# Patient Record
Sex: Female | Born: 1950
Health system: Southern US, Community
[De-identification: ages and names within clinical notes are randomized; demographics above are authoritative.]

## PROBLEM LIST (undated history)

## (undated) DIAGNOSIS — E079 Disorder of thyroid, unspecified: Secondary | ICD-10-CM

## (undated) DIAGNOSIS — I10 Essential (primary) hypertension: Secondary | ICD-10-CM

## (undated) DIAGNOSIS — D3A019 Benign carcinoid tumor of the small intestine, unspecified portion: Secondary | ICD-10-CM

## (undated) DIAGNOSIS — E039 Hypothyroidism, unspecified: Secondary | ICD-10-CM

## (undated) HISTORY — PX: HEMORRHOID SURGERY: SHX153

---

## 1998-06-30 ENCOUNTER — Ambulatory Visit (HOSPITAL_COMMUNITY): Admission: RE | Admit: 1998-06-30 | Discharge: 1998-06-30 | Payer: Self-pay | Admitting: Surgery

## 1999-07-22 ENCOUNTER — Other Ambulatory Visit: Admission: RE | Admit: 1999-07-22 | Discharge: 1999-07-22 | Payer: Self-pay | Admitting: Family Medicine

## 1999-10-21 ENCOUNTER — Ambulatory Visit (HOSPITAL_COMMUNITY): Admission: RE | Admit: 1999-10-21 | Discharge: 1999-10-21 | Payer: Self-pay | Admitting: Surgery

## 1999-10-21 ENCOUNTER — Encounter: Payer: Self-pay | Admitting: Surgery

## 1999-12-11 ENCOUNTER — Encounter (HOSPITAL_COMMUNITY): Admission: RE | Admit: 1999-12-11 | Discharge: 2000-03-10 | Payer: Self-pay | Admitting: Surgery

## 2000-07-27 ENCOUNTER — Other Ambulatory Visit: Admission: RE | Admit: 2000-07-27 | Discharge: 2000-07-27 | Payer: Self-pay | Admitting: Family Medicine

## 2004-04-22 ENCOUNTER — Emergency Department (HOSPITAL_COMMUNITY): Admission: EM | Admit: 2004-04-22 | Discharge: 2004-04-23 | Payer: Self-pay | Admitting: Emergency Medicine

## 2004-05-25 ENCOUNTER — Other Ambulatory Visit: Admission: RE | Admit: 2004-05-25 | Discharge: 2004-05-25 | Payer: Self-pay | Admitting: Family Medicine

## 2004-11-17 ENCOUNTER — Other Ambulatory Visit: Admission: RE | Admit: 2004-11-17 | Discharge: 2004-11-17 | Payer: Self-pay | Admitting: Obstetrics and Gynecology

## 2005-01-15 ENCOUNTER — Encounter (INDEPENDENT_AMBULATORY_CARE_PROVIDER_SITE_OTHER): Payer: Self-pay | Admitting: *Deleted

## 2005-01-15 ENCOUNTER — Ambulatory Visit (HOSPITAL_COMMUNITY): Admission: RE | Admit: 2005-01-15 | Discharge: 2005-01-15 | Payer: Self-pay | Admitting: Obstetrics and Gynecology

## 2006-07-21 ENCOUNTER — Other Ambulatory Visit: Admission: RE | Admit: 2006-07-21 | Discharge: 2006-07-21 | Payer: Self-pay | Admitting: Obstetrics and Gynecology

## 2007-08-25 ENCOUNTER — Other Ambulatory Visit: Admission: RE | Admit: 2007-08-25 | Discharge: 2007-08-25 | Payer: Self-pay | Admitting: Obstetrics and Gynecology

## 2009-01-31 ENCOUNTER — Encounter: Admission: RE | Admit: 2009-01-31 | Discharge: 2009-01-31 | Payer: Self-pay | Admitting: Internal Medicine

## 2009-10-28 ENCOUNTER — Encounter: Admission: RE | Admit: 2009-10-28 | Discharge: 2009-10-28 | Payer: Self-pay | Admitting: Gastroenterology

## 2011-04-16 NOTE — Op Note (Signed)
NAMEFINLEIGH, CHEONG               ACCOUNT NO.:  [id number]   MEDICAL RECORD NO.:  [id number]          PATIENT TYPE:  AMB   LOCATION:  SDC                           FACILITY:  [facility]   PHYSICIAN:  Charles A. Delcambre, MDDATE OF BIRTH:  [birth year]   DATE OF PROCEDURE:  01/15/2005  DATE OF DISCHARGE:                                 OPERATIVE REPORT   PREOPERATIVE DIAGNOSIS:  Atypical glandular cells in Papanicolaou smear with  glandular involvement of dysplasia, possible dysplasia.   POSTOPERATIVE DIAGNOSIS:  Atypical glandular cells in Papanicolaou smear  with glandular involvement of dysplasia, possible dysplasia.   PROCEDURES:  1.  Paracervical block.  2.  Cold knife conization.  3.  Endocervical curettage.  4.  Dilation and curettage.   SURGEON:  Charles A. Delcambre, MD   ASSISTANT:  None.   COMPLICATIONS:  None.   ESTIMATED BLOOD LOSS:  Less than 25 mL.   SPECIMENS:  1.  Cold knife conization specimen at 12 o'clock.  2.  Endocervical curettage.  3.  Dilation and curettage.   OPERATIVE FINDINGS:  Sounded to 10 cm.   DESCRIPTION OF PROCEDURE:  The patient was taken to the operating room and  placed in the supine position.  A general anesthetic was induced without  difficulty.  She was then placed in the dorsal lithotomy position and  sterilely prepped and draped.  A single-tooth tenaculum was placed on the  anterior lip of the cervix and a paracervical block was then placed with  0.25% Marcaine with epinephrine.  This was then placed circumferentially  around to assist with hemostasis.  A total of approximately 8 mL, see OR  record for exact amount, was injected in divided.  There was no evidence of  intravascular injection.  A knife was then used to cut the cone specimen  away.  After endocervical curetting was obtained, a D&C was then obtained,  specimen was obtained with some minor dilation with the Hegar dilator.  There was no evidence of perforation.  A small  amount of tissue was  obtained.  Ball cautery was then used at 50 watts to cauterize the cone bed.  The cone was made deeply at the endocervical canal to obtain an adequate  specimen up the canal.  Hemostasis was adequate.  Vicryl 0 was then used to  run the cervical  mucosal edge of the cone, running locking.  Hemostasis was excellent.  Monsel's solution was placed in the cone bed prophylactically.  Hemostasis  was verified to be excellent.  The patient was awakened and taken to  recovery with physician in attendance, having tolerated the procedure well.      CAD/MEDQ  D:  01/15/2005  T:  01/15/2005  Job:  [id number]

## 2011-04-16 NOTE — H&P (Signed)
Anne Stark, Anne Stark               ACCOUNT NO.:  [id number]   MEDICAL RECORD NO.:  [id number]          PATIENT TYPE:  AMB   LOCATION:  SDC                           FACILITY:  [facility]   PHYSICIAN:  Charles A. Delcambre, MDDATE OF BIRTH:  05/01/1951   DATE OF ADMISSION:  01/15/2005  DATE OF DISCHARGE:                                HISTORY & PHYSICAL   This patient to be admitted January 15, 2005, to undergo cold-knife  conization and D&C secondary to adenocarcinoma in situ possibility with  atypical glandular cells on a Pap smear.  There was possible glandular  involvement of dysplasia and AIS must be ruled out, as discussed with the  patient.  She gives informed consent, accepts risks of infection, bleeding,  bowel, rectal, and bladder damage, hepatitis and HIV exposure with blood  products.  All questions are answered.  She will remain NPO past midnight.  EV prior to surgery, take hydrochlorothiazide and use Maxair the morning of  surgery, Maxair the evening prior to surgery.  All questions are answered.  She gives informed consent.   PAST MEDICAL HISTORY:  1.  Hypertension.  2.  Hypothyroidism.  3.  Osteoporosis.  4.  Seasonal allergies.   SURGICAL HISTORY:  1.  Tonsillectomy.  2.  Hemorrhoidectomy.  3.  D&C in 1999.   MEDICATIONS:  1.  HCTZ 25 mg daily.  2.  Maxair aerosol daily.  3.  Zyrtec 10 mg daily.  4.  Synthroid 50 mcg daily.  5.  Baby aspirin 81 mg daily, discontinued December 30, 2004.  6.  Calcium 600 mg daily.  7.  Fosamax 70 mg weekly.   ALLERGIES:  No known drug allergies.   SOCIAL HISTORY:  No tobacco, ethanol, drug use, or STD exposure in the past.  She is married and has a monogamous relationship with her husband.   FAMILY HISTORY:  Father deceased, 84, emphysema.  Mother deceased, 49,  arthritis.  Brother 48 with arthritis.  Sister, 59, bipolar.  Otherwise,  negative family history.   REVIEW OF SYSTEMS:  No fever, chills, rashes, lesions, headaches,  dizziness,  chest pain, shortness of breath, wheezing, diarrhea, constipation, bleeding,  melena, hematochezia, urgency, frequency, dysuria, incontinence, hematuria,  __________, or emotional changes.   PHYSICAL EXAMINATION:  GENERAL:  Alert and oriented x 3, no distress.  VITAL SIGNS:  Blood pressure 120/70, heart rate 70, weight 131 pounds.  NECK:  Supple without thyromegaly or adenopathy.  LUNGS:  Clear bilaterally.  HEART:  Regular rate and rhythm without murmur, rub, or gallop.  BREASTS:  Symmetrical.  The remainder of the examination deferred.  ABDOMEN:  Soft, flat, nontender.  PELVIC:  Normal external female genitalia, Bartholin, urethral, Skene's  within normal limits.  Vault without discharge or lesions.  Multiparous  cervix.  No cervical motion tenderness is present.  Negative colposcopy was  noted.  She was noted to be HPV negative.  Endocervix was benign  endocervical glands on colposcopy.  Low-grade lesion was noted at 9 o'clock.  Endometrial biopsy was benign with superficial endometrial glands.  Uterus  was not enlarged.  Adnexa  nontender.  Ovaries not palpable bilaterally.   ASSESSMENT:  Atypical glandular cells on Pap smear.  Glandular involvement  with possible dysplasia.   PLAN:  Cold-knife conization with D&C __________ will follow with surgery  this Friday.      CAD/MEDQ  D:  01/11/2005  T:  01/11/2005  Job:  [id number]

## 2012-05-11 ENCOUNTER — Other Ambulatory Visit (HOSPITAL_COMMUNITY)
Admission: RE | Admit: 2012-05-11 | Discharge: 2012-05-11 | Disposition: A | Discharge status: Home / Self Care | Payer: BC Managed Care – PPO | Source: Ambulatory Visit | Attending: Family Medicine | Admitting: Family Medicine

## 2012-05-11 ENCOUNTER — Other Ambulatory Visit: Payer: Self-pay | Admitting: Family Medicine

## 2012-05-11 DIAGNOSIS — Z0000 Encounter for general adult medical examination without abnormal findings: Secondary | ICD-10-CM | POA: Insufficient documentation

## 2012-06-09 ENCOUNTER — Encounter (HOSPITAL_COMMUNITY): Payer: Self-pay | Admitting: *Deleted

## 2012-06-09 ENCOUNTER — Emergency Department (HOSPITAL_COMMUNITY)
Admission: EM | Admit: 2012-06-09 | Discharge: 2012-06-09 | Disposition: A | Discharge status: Home / Self Care | Payer: BC Managed Care – PPO | Attending: Emergency Medicine | Admitting: Emergency Medicine

## 2012-06-09 DIAGNOSIS — R109 Unspecified abdominal pain: Secondary | ICD-10-CM

## 2012-06-09 DIAGNOSIS — R1084 Generalized abdominal pain: Secondary | ICD-10-CM | POA: Insufficient documentation

## 2012-06-09 DIAGNOSIS — R1110 Vomiting, unspecified: Secondary | ICD-10-CM | POA: Insufficient documentation

## 2012-06-09 DIAGNOSIS — R197 Diarrhea, unspecified: Secondary | ICD-10-CM | POA: Insufficient documentation

## 2012-06-09 LAB — COMPREHENSIVE METABOLIC PANEL
Albumin: 4.2 g/dL (ref 3.5–5.2)
BUN: 20 mg/dL (ref 6–23)
Creatinine, Ser: 0.83 mg/dL (ref 0.50–1.10)
Total Protein: 7.6 g/dL (ref 6.0–8.3)

## 2012-06-09 LAB — CBC WITH DIFFERENTIAL/PLATELET
Basophils Absolute: 0 10*3/uL (ref 0.0–0.1)
Basophils Relative: 0 % (ref 0–1)
Eosinophils Absolute: 0 10*3/uL (ref 0.0–0.7)
HCT: 44.5 % (ref 36.0–46.0)
Hemoglobin: 15.3 g/dL — ABNORMAL HIGH (ref 12.0–15.0)
MCH: 30.5 pg (ref 26.0–34.0)
MCHC: 34.4 g/dL (ref 30.0–36.0)
Monocytes Absolute: 0.6 10*3/uL (ref 0.1–1.0)
Monocytes Relative: 5 % (ref 3–12)
Neutrophils Relative %: 83 % — ABNORMAL HIGH (ref 43–77)
RDW: 12.5 % (ref 11.5–15.5)

## 2012-06-09 LAB — LIPASE, BLOOD: Lipase: 43 U/L (ref 11–59)

## 2012-06-09 MED ORDER — SODIUM CHLORIDE 0.9 % IV BOLUS (SEPSIS)
1000.0000 mL | Freq: Once | INTRAVENOUS | Status: AC
  Administered 2012-06-09: 1000 mL via INTRAVENOUS

## 2012-06-09 MED ORDER — ONDANSETRON HCL 4 MG/2ML IJ SOLN
4.0000 mg | Freq: Once | INTRAMUSCULAR | Status: AC
  Administered 2012-06-09: 4 mg via INTRAVENOUS
  Filled 2012-06-09: qty 2

## 2012-06-09 MED ORDER — MORPHINE SULFATE 4 MG/ML IJ SOLN
4.0000 mg | Freq: Once | INTRAMUSCULAR | Status: AC
  Administered 2012-06-09: 4 mg via INTRAVENOUS
  Filled 2012-06-09: qty 1

## 2012-06-09 MED ORDER — ONDANSETRON HCL 4 MG PO TABS: 4.0000 mg | ORAL_TABLET | Freq: Four times a day (QID) | ORAL | Status: AC

## 2012-06-09 NOTE — ED Provider Notes (Addendum)
History     CSN: [id number]  Arrival date & time 06/09/12  0405   First MD Initiated Contact with Patient 06/09/12 0411      Chief Complaint  Patient presents with  . Abdominal Pain    (Consider location/radiation/quality/duration/timing/severity/associated sxs/prior treatment) Patient is a 61 y.o. female presenting with abdominal pain. The history is provided by the patient.  Abdominal Pain The primary symptoms of the illness include abdominal pain, nausea, vomiting and diarrhea. The current episode started 6 to 12 hours ago. The onset of the illness was sudden. The problem has not changed since onset. The abdominal pain began 6 to 12 hours ago. The pain came on suddenly. The abdominal pain is generalized (cramping pain that comes in waves). The abdominal pain does not radiate. The severity of the abdominal pain is 8/10. Relieved by: gets better for a little while after vomiting and diarrhea.  Vomiting occurs 2 to 5 times per day. The emesis contains stomach contents.  The diarrhea began today. The diarrhea is watery. The diarrhea occurs 2 to 4 times per day.  Additional symptoms associated with the illness include anorexia and back pain. Symptoms associated with the illness do not include chills, constipation, urgency or frequency. Associated medical issues comments: states this has happened several times.  the last time was 2 years ago.Marland Kitchen    History reviewed. No pertinent past medical history.  History reviewed. No pertinent past surgical history.  History reviewed. No pertinent family history.  History  Substance Use Topics  . Smoking status: Never Smoker   . Smokeless tobacco: Not on file  . Alcohol Use: No    OB History    Grav Para Term Preterm Abortions TAB SAB Ect Mult Living                  Review of Systems  Constitutional: Negative for chills.  Gastrointestinal: Positive for nausea, vomiting, abdominal pain, diarrhea and anorexia. Negative for constipation.    Genitourinary: Negative for urgency and frequency.  Musculoskeletal: Positive for back pain.  All other systems reviewed and are negative.    Allergies  Review of patient's allergies indicates no known allergies.  Home Medications   Current Outpatient Rx  Name Route Sig Dispense Refill  . ALBUTEROL SULFATE HFA 108 (90 BASE) MCG/ACT IN AERS Inhalation Inhale 2 puffs into the lungs every 6 (six) hours as needed. For shortness of breath or wheeze    . ALENDRONATE SODIUM 70 MG PO TABS Oral Take 70 mg by mouth every 7 (seven) days. Take with a full glass of water on an empty stomach.    . ASPIRIN EC 81 MG PO TBEC Oral Take 81 mg by mouth daily.    . ATORVASTATIN CALCIUM 10 MG PO TABS Oral Take 10 mg by mouth daily.    . AZELASTINE HCL 137 MCG/SPRAY NA SOLN Nasal Place 1 spray into the nose 2 (two) times daily as needed. Use in each nostril as directed    . HYDROCHLOROTHIAZIDE 25 MG PO TABS Oral Take 25 mg by mouth daily.    Marland Kitchen LORATADINE 10 MG PO TABS Oral Take 10 mg by mouth daily.    . ADULT MULTIVITAMIN W/MINERALS CH Oral Take 1 tablet by mouth daily.      BP 149/65  Pulse 70  Temp 97.5 F (36.4 C) (Oral)  Resp 18  SpO2 100%  Physical Exam  Nursing note and vitals reviewed. Constitutional: She is oriented to person, place, and time. She appears  well-developed and well-nourished. No distress.  HENT:  Head: Normocephalic and atraumatic.  Mouth/Throat: Oropharynx is clear and moist.  Eyes: Conjunctivae and EOM are normal. Pupils are equal, round, and reactive to light.  Neck: Normal range of motion. Neck supple.  Cardiovascular: Normal rate, regular rhythm and intact distal pulses.   No murmur heard. Pulmonary/Chest: Effort normal and breath sounds normal. No respiratory distress. She has no wheezes. She has no rales.  Abdominal: Soft. Normal appearance. She exhibits no distension. Bowel sounds are decreased. There is generalized tenderness. There is no rebound, no guarding and  no CVA tenderness.  Musculoskeletal: Normal range of motion. She exhibits no edema and no tenderness.  Neurological: She is alert and oriented to person, place, and time.  Skin: Skin is warm and dry. No rash noted. No erythema.  Psychiatric: She has a normal mood and affect. Her behavior is normal.    ED Course  Procedures (including critical care time)  Labs Reviewed  CBC WITH DIFFERENTIAL - Abnormal; Notable for the following:    WBC 11.9 (*)     Hemoglobin 15.3 (*)     Neutrophils Relative 83 (*)     Neutro Abs 9.9 (*)     All other components within normal limits  COMPREHENSIVE METABOLIC PANEL - Abnormal; Notable for the following:    Potassium 3.2 (*)     Glucose, Bld 115 (*)     GFR calc non Af Amer 74 (*)     GFR calc Af Amer 86 (*)     All other components within normal limits  LIPASE, BLOOD   No results found.   1. Vomiting and diarrhea   2. Abdominal  pain, other specified site       MDM   Patient with abdominal cramping, vomiting and diarrhea that started about 6 hours ago. She states it comes in waves and the pain becomes so intense that she can hardly bear it.  She states this has happened multiple times in the past the last episode was 2 years ago. She's been seen by Dr.Buccini and had an endoscopy and colonoscopy without explanation of why this occurs. She denies any bad food exposure or recent sick contacts. No new medication changes. No urinary or pulmonary symptoms.  Her abdomen is mildly diffusely tender without peritoneal signs. CBC with only slight leukocytosis of 11.9 and mild hypokalemia of 3.2 but otherwise within normal limits. Patient given IV fluids, Zofran and morphine for symptomatic treatment.  5:38 AM ON re-eval pt is feeling better and no significant abd pain at this point.  Will po challenge.   6:42 AM Tolerating po's and wants to go home.  Will f/u with GI.     Gwyneth Sprout, MD 06/09/12 [id number]  Gwyneth Sprout, MD 06/09/12  1191  Gwyneth Sprout, MD 06/09/12 [phone]

## 2012-06-09 NOTE — ED Notes (Signed)
Patient states she felt fine yest last pm felt like her left side was swollen she had one loose stool then several episodes of diarrhea vomited x1. States abd. Pain is generalized. States she has experienced same before and was seen by  Her Gi doctor had upper and lower gi studies with no dx.

## 2012-06-09 NOTE — ED Notes (Signed)
Patient resting on stretcher  States she feels better. Husband at bedside.

## 2015-06-18 DIAGNOSIS — E039 Hypothyroidism, unspecified: Secondary | ICD-10-CM | POA: Diagnosis not present

## 2015-06-18 DIAGNOSIS — I10 Essential (primary) hypertension: Secondary | ICD-10-CM | POA: Diagnosis not present

## 2015-06-18 DIAGNOSIS — R400 Somnolence: Secondary | ICD-10-CM | POA: Diagnosis not present

## 2015-06-18 DIAGNOSIS — E780 Pure hypercholesterolemia: Secondary | ICD-10-CM | POA: Diagnosis not present

## 2015-11-23 ENCOUNTER — Emergency Department (HOSPITAL_COMMUNITY)
Admission: EM | Admit: 2015-11-23 | Discharge: 2015-11-23 | Disposition: A | Discharge status: Home / Self Care | Payer: Medicare Other | Attending: Emergency Medicine | Admitting: Emergency Medicine

## 2015-11-23 ENCOUNTER — Encounter (HOSPITAL_COMMUNITY): Payer: Self-pay | Admitting: Emergency Medicine

## 2015-11-23 DIAGNOSIS — R109 Unspecified abdominal pain: Secondary | ICD-10-CM | POA: Diagnosis present

## 2015-11-23 DIAGNOSIS — Z7982 Long term (current) use of aspirin: Secondary | ICD-10-CM | POA: Insufficient documentation

## 2015-11-23 DIAGNOSIS — Z79899 Other long term (current) drug therapy: Secondary | ICD-10-CM | POA: Insufficient documentation

## 2015-11-23 DIAGNOSIS — I10 Essential (primary) hypertension: Secondary | ICD-10-CM | POA: Insufficient documentation

## 2015-11-23 DIAGNOSIS — R112 Nausea with vomiting, unspecified: Secondary | ICD-10-CM | POA: Diagnosis not present

## 2015-11-23 DIAGNOSIS — E079 Disorder of thyroid, unspecified: Secondary | ICD-10-CM | POA: Diagnosis not present

## 2015-11-23 DIAGNOSIS — R197 Diarrhea, unspecified: Secondary | ICD-10-CM | POA: Insufficient documentation

## 2015-11-23 HISTORY — DX: Essential (primary) hypertension: I10

## 2015-11-23 HISTORY — DX: Disorder of thyroid, unspecified: E07.9

## 2015-11-23 LAB — COMPREHENSIVE METABOLIC PANEL
ALT: 22 U/L (ref 14–54)
AST: 30 U/L (ref 15–41)
Albumin: 3.6 g/dL (ref 3.5–5.0)
Alkaline Phosphatase: 53 U/L (ref 38–126)
Anion gap: 8 (ref 5–15)
BILIRUBIN TOTAL: 0.8 mg/dL (ref 0.3–1.2)
BUN: 11 mg/dL (ref 6–20)
CO2: 26 mmol/L (ref 22–32)
CREATININE: 0.85 mg/dL (ref 0.44–1.00)
Calcium: 9.2 mg/dL (ref 8.9–10.3)
Chloride: 108 mmol/L (ref 101–111)
Glucose, Bld: 147 mg/dL — ABNORMAL HIGH (ref 65–99)
POTASSIUM: 3.9 mmol/L (ref 3.5–5.1)
Sodium: 142 mmol/L (ref 135–145)
TOTAL PROTEIN: 6.7 g/dL (ref 6.5–8.1)

## 2015-11-23 LAB — URINALYSIS, ROUTINE W REFLEX MICROSCOPIC
Bilirubin Urine: NEGATIVE
Glucose, UA: NEGATIVE mg/dL
Hgb urine dipstick: NEGATIVE
KETONES UR: 15 mg/dL — AB
NITRITE: NEGATIVE
PROTEIN: NEGATIVE mg/dL
Specific Gravity, Urine: 1.021 (ref 1.005–1.030)
pH: 8.5 — ABNORMAL HIGH (ref 5.0–8.0)

## 2015-11-23 LAB — URINE MICROSCOPIC-ADD ON

## 2015-11-23 LAB — DIFFERENTIAL
BASOS ABS: 0 10*3/uL (ref 0.0–0.1)
BASOS PCT: 0 %
Eosinophils Absolute: 0 10*3/uL (ref 0.0–0.7)
Eosinophils Relative: 0 %
Lymphocytes Relative: 10 %
Lymphs Abs: 1 10*3/uL (ref 0.7–4.0)
Monocytes Absolute: 0.2 10*3/uL (ref 0.1–1.0)
Monocytes Relative: 2 %
NEUTROS ABS: 9.2 10*3/uL — AB (ref 1.7–7.7)
Neutrophils Relative %: 88 %

## 2015-11-23 LAB — CBC
HEMATOCRIT: 44.9 % (ref 36.0–46.0)
Hemoglobin: 14.8 g/dL (ref 12.0–15.0)
MCH: 30.3 pg (ref 26.0–34.0)
MCHC: 33 g/dL (ref 30.0–36.0)
MCV: 92 fL (ref 78.0–100.0)
PLATELETS: 354 10*3/uL (ref 150–400)
RBC: 4.88 MIL/uL (ref 3.87–5.11)
RDW: 12.2 % (ref 11.5–15.5)
WBC: 10.4 10*3/uL (ref 4.0–10.5)

## 2015-11-23 LAB — LIPASE, BLOOD: Lipase: 28 U/L (ref 11–51)

## 2015-11-23 MED ORDER — ONDANSETRON HCL 4 MG/2ML IJ SOLN
4.0000 mg | Freq: Once | INTRAMUSCULAR | Status: AC
  Administered 2015-11-23: 4 mg via INTRAVENOUS
  Filled 2015-11-23: qty 2

## 2015-11-23 MED ORDER — MORPHINE SULFATE (PF) 4 MG/ML IV SOLN
4.0000 mg | Freq: Once | INTRAVENOUS | Status: AC
  Administered 2015-11-23: 4 mg via INTRAVENOUS
  Filled 2015-11-23: qty 1

## 2015-11-23 MED ORDER — SODIUM CHLORIDE 0.9 % IV SOLN
1000.0000 mL | Freq: Once | INTRAVENOUS | Status: AC
  Administered 2015-11-23: 1000 mL via INTRAVENOUS

## 2015-11-23 MED ORDER — DICYCLOMINE HCL 10 MG PO CAPS
20.0000 mg | ORAL_CAPSULE | Freq: Once | ORAL | Status: AC
  Administered 2015-11-23: 20 mg via ORAL
  Filled 2015-11-23: qty 2

## 2015-11-23 MED ORDER — SODIUM CHLORIDE 0.9 % IV SOLN
1000.0000 mL | INTRAVENOUS | Status: DC
  Administered 2015-11-23: 1000 mL via INTRAVENOUS

## 2015-11-23 NOTE — ED Provider Notes (Signed)
CSN: [id number]     Arrival date & time 11/23/15  0009 History  By signing my name below, I, Randa Evens, attest that this documentation has been prepared under the direction and in the presence of Delora Fuel, MD. Electronically Signed: Randa Evens, ED Scribe. 11/23/2015. 3:55 AM.      Chief Complaint  Patient presents with  . Abdominal Pain  . Nausea   The history is provided by the patient.   HPI Comments: Anne Stark is a 64 y.o. female brought in by ambulance, who presents to the Emergency Department complaining of cramping abdominal pain onset 6 hours PTA. Rates the severity of her pain 9/10. Pt states she has associated chills, nausea, diarrhea. She report some relief of the pain while having a BM. Denies any medications PTA. denies recent sick contacts. Denies suspect food intact.    Past Medical History  Diagnosis Date  . Hypertension   . Thyroid disease    History reviewed. No pertinent past surgical history. History reviewed. No pertinent family history. Social History  Substance Use Topics  . Smoking status: Never Smoker   . Smokeless tobacco: None  . Alcohol Use: No   OB History    No data available      Review of Systems  Constitutional: Positive for chills. Negative for fever.  Gastrointestinal: Positive for nausea, abdominal pain and diarrhea. Negative for vomiting.  All other systems reviewed and are negative.    Allergies  Review of patient's allergies indicates no known allergies.  Home Medications   Prior to Admission medications   Medication Sig Start Date End Date Taking? Authorizing Provider  albuterol (PROVENTIL HFA;VENTOLIN HFA) 108 (90 BASE) MCG/ACT inhaler Inhale 2 puffs into the lungs every 6 (six) hours as needed. For shortness of breath or wheeze   Yes Historical Provider, MD  alendronate (FOSAMAX) 70 MG tablet Take 70 mg by mouth every 7 (seven) days. Take with a full glass of water on an empty stomach.   Yes Historical  Provider, MD  aspirin EC 81 MG tablet Take 81 mg by mouth daily.   Yes Historical Provider, MD  atorvastatin (LIPITOR) 10 MG tablet Take 10 mg by mouth daily.   Yes Historical Provider, MD  azelastine (ASTELIN) 137 MCG/SPRAY nasal spray Place 1 spray into the nose 2 (two) times daily as needed for rhinitis or allergies. Use in each nostril as directed   Yes Historical Provider, MD  celecoxib (CELEBREX) 200 MG capsule Take 200 mg by mouth daily as needed for mild pain.   Yes Historical Provider, MD  furosemide (LASIX) 20 MG tablet Take 20 mg by mouth daily.   Yes Historical Provider, MD  Glucosamine Sulfate 1000 MG CAPS Take 1,000 mg by mouth daily.   Yes Historical Provider, MD  hyoscyamine (LEVSIN SL) 0.125 MG SL tablet Place 0.125 mg under the tongue every 4 (four) hours as needed for cramping.   Yes Historical Provider, MD  levothyroxine (SYNTHROID, LEVOTHROID) 50 MCG tablet Take 50 mcg by mouth daily before breakfast.   Yes Historical Provider, MD  loratadine (CLARITIN) 10 MG tablet Take 10 mg by mouth daily.   Yes Historical Provider, MD  metroNIDAZOLE (METROCREAM) 0.75 % cream Apply 1 application topically daily.   Yes Historical Provider, MD  Multiple Vitamin (MULTIVITAMIN WITH MINERALS) TABS Take 1 tablet by mouth daily.   Yes Historical Provider, MD  potassium chloride (K-DUR,KLOR-CON) 10 MEQ tablet Take 10 mEq by mouth daily.   Yes Historical Provider, MD  BP 142/78 mmHg  Pulse 69  Temp(Src) 99.2 F (37.3 C) (Oral)  Resp 16  SpO2 97%   Physical Exam  Constitutional: She is oriented to person, place, and time. She appears well-developed and well-nourished. No distress.  HENT:  Head: Normocephalic and atraumatic.  Eyes: Conjunctivae and EOM are normal. Pupils are equal, round, and reactive to light.  Neck: Normal range of motion. Neck supple. No JVD present.  Cardiovascular: Normal rate, regular rhythm and normal heart sounds.   No murmur heard. Pulmonary/Chest: Effort normal  and breath sounds normal. She has no wheezes. She has no rales. She exhibits no tenderness.  Abdominal: Soft. Bowel sounds are normal. She exhibits no distension and no mass. There is no tenderness.  Abdomen bowel sound decreased.   Musculoskeletal: Normal range of motion. She exhibits no edema.  Lymphadenopathy:    She has no cervical adenopathy.  Neurological: She is alert and oriented to person, place, and time. No cranial nerve deficit. She exhibits normal muscle tone. Coordination normal.  Skin: Skin is warm and dry. No rash noted.  Psychiatric: She has a normal mood and affect. Her behavior is normal. Judgment and thought content normal.  Nursing note and vitals reviewed.   ED Course  Procedures (including critical care time) DIAGNOSTIC STUDIES: Oxygen Saturation is 95% on RA, normal by my interpretation.    COORDINATION OF CARE: 12:42 AM-Discussed treatment plan with pt at bedside and pt agreed to plan.    Labs Review Results for orders placed or performed during the hospital encounter of 11/23/15  Lipase, blood  Result Value Ref Range   Lipase 28 11 - 51 U/L  Comprehensive metabolic panel  Result Value Ref Range   Sodium 142 135 - 145 mmol/L   Potassium 3.9 3.5 - 5.1 mmol/L   Chloride 108 101 - 111 mmol/L   CO2 26 22 - 32 mmol/L   Glucose, Bld 147 (H) 65 - 99 mg/dL   BUN 11 6 - 20 mg/dL   Creatinine, Ser 0.85 0.44 - 1.00 mg/dL   Calcium 9.2 8.9 - 10.3 mg/dL   Total Protein 6.7 6.5 - 8.1 g/dL   Albumin 3.6 3.5 - 5.0 g/dL   AST 30 15 - 41 U/L   ALT 22 14 - 54 U/L   Alkaline Phosphatase 53 38 - 126 U/L   Total Bilirubin 0.8 0.3 - 1.2 mg/dL   GFR calc non Af Amer >60 >60 mL/min   GFR calc Af Amer >60 >60 mL/min   Anion gap 8 5 - 15  CBC  Result Value Ref Range   WBC 10.4 4.0 - 10.5 K/uL   RBC 4.88 3.87 - 5.11 MIL/uL   Hemoglobin 14.8 12.0 - 15.0 g/dL   HCT 44.9 36.0 - 46.0 %   MCV 92.0 78.0 - 100.0 fL   MCH 30.3 26.0 - 34.0 pg   MCHC 33.0 30.0 - 36.0 g/dL    RDW 12.2 11.5 - 15.5 %   Platelets 354 150 - 400 K/uL  Urinalysis, Routine w reflex microscopic (not at [facility])  Result Value Ref Range   Color, Urine YELLOW YELLOW   APPearance CLOUDY (A) CLEAR   Specific Gravity, Urine 1.021 1.005 - 1.030   pH 8.5 (H) 5.0 - 8.0   Glucose, UA NEGATIVE NEGATIVE mg/dL   Hgb urine dipstick NEGATIVE NEGATIVE   Bilirubin Urine NEGATIVE NEGATIVE   Ketones, ur 15 (A) NEGATIVE mg/dL   Protein, ur NEGATIVE NEGATIVE mg/dL   Nitrite NEGATIVE NEGATIVE  Leukocytes, UA MODERATE (A) NEGATIVE  Differential  Result Value Ref Range   Neutrophils Relative % 88 %   Neutro Abs 9.2 (H) 1.7 - 7.7 K/uL   Lymphocytes Relative 10 %   Lymphs Abs 1.0 0.7 - 4.0 K/uL   Monocytes Relative 2 %   Monocytes Absolute 0.2 0.1 - 1.0 K/uL   Eosinophils Relative 0 %   Eosinophils Absolute 0.0 0.0 - 0.7 K/uL   Basophils Relative 0 %   Basophils Absolute 0.0 0.0 - 0.1 K/uL  Urine microscopic-add on  Result Value Ref Range   Squamous Epithelial / LPF 0-5 (A) NONE SEEN   WBC, UA 6-30 0 - 5 WBC/hpf   RBC / HPF 0-5 0 - 5 RBC/hpf   Bacteria, UA MANY (A) NONE SEEN   Urine-Other MUCOUS PRESENT     MDM   Final diagnoses:  Abdominal pain, unspecified abdominal location  Nausea vomiting and diarrhea      Abdominal cramping, nausea, diarrhea. This apparently is a recurrent problem for the patient. She tells me that loperamide is ineffective for her diarrhea and she uses hyoscyamine. Old records are reviewed and she does have a prior ED visit for vomiting and diarrhea. Hyoscyamine was not available to the ED so she was given a dose of dicyclomine as well as IV hydration. She was given ondansetron for nausea and morphine for pain. Following this, she was feeling much better. She states that she has hyoscyamine at home and has hydromorphone at home and did not feel that she needed any prescriptions. She is referred back to her PCP.    I personally performed the services described in this  documentation, which was scribed in my presence. The recorded information has been reviewed and is accurate.       Delora Fuel, MD [phone]

## 2015-11-23 NOTE — ED Notes (Signed)
Notified MD about many bacteria in urine. MD acknowledges

## 2015-11-23 NOTE — ED Notes (Signed)
Per EMS, pt has had bilateral abdominal pain since 1500. Pt states pain is 6/10, radiating into back. Pt has had intermittent nausea with food and drink, and diarrhea. Pt is AO x 4 with bp 160/84.

## 2015-11-23 NOTE — Discharge Instructions (Signed)
Continue taking hyoscyamine as needed. Return to the ED if symptoms are not being controlled adequately at home.  Your blood sugar was mildly elevated today - 147. This will need to be followed to make sure that you're not nearly status of developing diabetes.   Abdominal Pain, Adult Many things can cause abdominal pain. Usually, abdominal pain is not caused by a disease and will improve without treatment. It can often be observed and treated at home. Your health care provider will do a physical exam and possibly order blood tests and X-rays to help determine the seriousness of your pain. However, in many cases, more time must pass before a clear cause of the pain can be found. Before that point, your health care provider may not know if you need more testing or further treatment. HOME CARE INSTRUCTIONS Monitor your abdominal pain for any changes. The following actions may help to alleviate any discomfort you are experiencing:  Only take over-the-counter or prescription medicines as directed by your health care provider.  Do not take laxatives unless directed to do so by your health care provider.  Try a clear liquid diet (broth, tea, or water) as directed by your health care provider. Slowly move to a bland diet as tolerated. SEEK MEDICAL CARE IF:  You have unexplained abdominal pain.  You have abdominal pain associated with nausea or diarrhea.  You have pain when you urinate or have a bowel movement.  You experience abdominal pain that wakes you in the night.  You have abdominal pain that is worsened or improved by eating food.  You have abdominal pain that is worsened with eating fatty foods.  You have a fever. SEEK IMMEDIATE MEDICAL CARE IF:  Your pain does not go away within 2 hours.  You keep throwing up (vomiting).  Your pain is felt only in portions of the abdomen, such as the right side or the left lower portion of the abdomen.  You pass bloody or black tarry  stools. MAKE SURE YOU:  Understand these instructions.  Will watch your condition.  Will get help right away if you are not doing well or get worse.   This information is not intended to replace advice given to you by your health care provider. Make sure you discuss any questions you have with your health care provider.   Document Released: 08/25/2005 Document Revised: 08/06/2015 Document Reviewed: 07/25/2013 Elsevier Interactive Patient Education 2016 [employer]  Nausea and Vomiting Nausea is a sick feeling that often comes before throwing up (vomiting). Vomiting is a reflex where stomach contents come out of your mouth. Vomiting can cause severe loss of body fluids (dehydration). Children and elderly adults can become dehydrated quickly, especially if they also have diarrhea. Nausea and vomiting are symptoms of a condition or disease. It is important to find the cause of your symptoms. CAUSES   Direct irritation of the stomach lining. This irritation can result from increased acid production (gastroesophageal reflux disease), infection, food poisoning, taking certain medicines (such as nonsteroidal anti-inflammatory drugs), alcohol use, or tobacco use.  Signals from the brain.These signals could be caused by a headache, heat exposure, an inner ear disturbance, increased pressure in the brain from injury, infection, a tumor, or a concussion, pain, emotional stimulus, or metabolic problems.  An obstruction in the gastrointestinal tract (bowel obstruction).  Illnesses such as diabetes, hepatitis, gallbladder problems, appendicitis, kidney problems, cancer, sepsis, atypical symptoms of a heart attack, or eating disorders.  Medical treatments such as chemotherapy and  radiation.  Receiving medicine that makes you sleep (general anesthetic) during surgery. DIAGNOSIS Your caregiver may ask for tests to be done if the problems do not improve after a few days. Tests may also be done if  symptoms are severe or if the reason for the nausea and vomiting is not clear. Tests may include:  Urine tests.  Blood tests.  Stool tests.  Cultures (to look for evidence of infection).  X-rays or other imaging studies. Test results can help your caregiver make decisions about treatment or the need for additional tests. TREATMENT You need to stay well hydrated. Drink frequently but in small amounts.You may wish to drink water, sports drinks, clear broth, or eat frozen ice pops or gelatin dessert to help stay hydrated.When you eat, eating slowly may help prevent nausea.There are also some antinausea medicines that may help prevent nausea. HOME CARE INSTRUCTIONS   Take all medicine as directed by your caregiver.  If you do not have an appetite, do not force yourself to eat. However, you must continue to drink fluids.  If you have an appetite, eat a normal diet unless your caregiver tells you differently.  Eat a variety of complex carbohydrates (rice, wheat, potatoes, bread), lean meats, yogurt, fruits, and vegetables.  Avoid high-fat foods because they are more difficult to digest.  Drink enough water and fluids to keep your urine clear or pale yellow.  If you are dehydrated, ask your caregiver for specific rehydration instructions. Signs of dehydration may include:  Severe thirst.  Dry lips and mouth.  Dizziness.  Dark urine.  Decreasing urine frequency and amount.  Confusion.  Rapid breathing or pulse. SEEK IMMEDIATE MEDICAL CARE IF:   You have blood or brown flecks (like coffee grounds) in your vomit.  You have black or bloody stools.  You have a severe headache or stiff neck.  You are confused.  You have severe abdominal pain.  You have chest pain or trouble breathing.  You do not urinate at least once every 8 hours.  You develop cold or clammy skin.  You continue to vomit for longer than 24 to 48 hours.  You have a fever. MAKE SURE YOU:    Understand these instructions.  Will watch your condition.  Will get help right away if you are not doing well or get worse.   This information is not intended to replace advice given to you by your health care provider. Make sure you discuss any questions you have with your health care provider.   Document Released: 11/15/2005 Document Revised: 02/07/2012 Document Reviewed: 04/14/2011 Elsevier Interactive Patient Education 2016 [employer].   Diarrhea Diarrhea is frequent loose and watery bowel movements. It can cause you to feel weak and dehydrated. Dehydration can cause you to become tired and thirsty, have a dry mouth, and have decreased urination that often is dark yellow. Diarrhea is a sign of another problem, most often an infection that will not last long. In most cases, diarrhea typically lasts 2-3 days. However, it can last longer if it is a sign of something more serious. It is important to treat your diarrhea as directed by your caregiver to lessen or prevent future episodes of diarrhea. CAUSES  Some common causes include:  Gastrointestinal infections caused by viruses, bacteria, or parasites.  Food poisoning or food allergies.  Certain medicines, such as antibiotics, chemotherapy, and laxatives.  Artificial sweeteners and fructose.  Digestive disorders. HOME CARE INSTRUCTIONS  Ensure adequate fluid intake (hydration): Have 1 cup (8  oz) of fluid for each diarrhea episode. Avoid fluids that contain simple sugars or sports drinks, fruit juices, whole milk products, and sodas. Your urine should be clear or pale yellow if you are drinking enough fluids. Hydrate with an oral rehydration solution that you can purchase at pharmacies, retail stores, and online. You can prepare an oral rehydration solution at home by mixing the following ingredients together:   - tsp table salt.   tsp baking soda.   tsp salt substitute containing potassium chloride.  1  tablespoons  sugar.  1 L (34 oz) of water.  Certain foods and beverages may increase the speed at which food moves through the gastrointestinal (GI) tract. These foods and beverages should be avoided and include:  Caffeinated and alcoholic beverages.  High-fiber foods, such as raw fruits and vegetables, nuts, seeds, and whole grain breads and cereals.  Foods and beverages sweetened with sugar alcohols, such as xylitol, sorbitol, and mannitol.  Some foods may be well tolerated and may help thicken stool including:  Starchy foods, such as rice, toast, pasta, low-sugar cereal, oatmeal, grits, baked potatoes, crackers, and bagels.  Bananas.  Applesauce.  Add probiotic-rich foods to help increase healthy bacteria in the GI tract, such as yogurt and fermented milk products.  Wash your hands well after each diarrhea episode.  Only take over-the-counter or prescription medicines as directed by your caregiver.  Take a warm bath to relieve any burning or pain from frequent diarrhea episodes. SEEK IMMEDIATE MEDICAL CARE IF:   You are unable to keep fluids down.  You have persistent vomiting.  You have blood in your stool, or your stools are black and tarry.  You do not urinate in 6-8 hours, or there is only a small amount of very dark urine.  You have abdominal pain that increases or localizes.  You have weakness, dizziness, confusion, or light-headedness.  You have a severe headache.  Your diarrhea gets worse or does not get better.  You have a fever or persistent symptoms for more than 2-3 days.  You have a fever and your symptoms suddenly get worse. MAKE SURE YOU:   Understand these instructions.  Will watch your condition.  Will get help right away if you are not doing well or get worse.   This information is not intended to replace advice given to you by your health care provider. Make sure you discuss any questions you have with your health care provider.   Document Released:  11/05/2002 Document Revised: 12/06/2014 Document Reviewed: 07/23/2012 Elsevier Interactive Patient Education [employer].

## 2016-06-21 ENCOUNTER — Other Ambulatory Visit (HOSPITAL_COMMUNITY)
Admission: RE | Admit: 2016-06-21 | Discharge: 2016-06-21 | Disposition: A | Discharge status: Home / Self Care | Payer: Medicare Other | Source: Ambulatory Visit | Attending: Family Medicine | Admitting: Family Medicine

## 2016-06-21 ENCOUNTER — Other Ambulatory Visit: Payer: Self-pay | Admitting: Family Medicine

## 2016-06-21 DIAGNOSIS — Z124 Encounter for screening for malignant neoplasm of cervix: Secondary | ICD-10-CM | POA: Diagnosis present

## 2016-06-22 LAB — CYTOLOGY - PAP

## 2016-07-25 ENCOUNTER — Inpatient Hospital Stay (HOSPITAL_COMMUNITY)
Admission: EM | Admit: 2016-07-25 | Discharge: 2016-08-02 | DRG: 330 | Disposition: A | Discharge status: Home Health | Payer: Medicare Other | Attending: Family Medicine | Admitting: Family Medicine

## 2016-07-25 ENCOUNTER — Emergency Department (HOSPITAL_COMMUNITY): Payer: Medicare Other

## 2016-07-25 ENCOUNTER — Encounter (HOSPITAL_COMMUNITY): Payer: Self-pay | Admitting: *Deleted

## 2016-07-25 DIAGNOSIS — D490 Neoplasm of unspecified behavior of digestive system: Secondary | ICD-10-CM | POA: Diagnosis present

## 2016-07-25 DIAGNOSIS — Z7982 Long term (current) use of aspirin: Secondary | ICD-10-CM

## 2016-07-25 DIAGNOSIS — Z79899 Other long term (current) drug therapy: Secondary | ICD-10-CM | POA: Diagnosis not present

## 2016-07-25 DIAGNOSIS — K56609 Unspecified intestinal obstruction, unspecified as to partial versus complete obstruction: Secondary | ICD-10-CM | POA: Diagnosis present

## 2016-07-25 DIAGNOSIS — E876 Hypokalemia: Secondary | ICD-10-CM | POA: Diagnosis not present

## 2016-07-25 DIAGNOSIS — C7A012 Malignant carcinoid tumor of the ileum: Secondary | ICD-10-CM | POA: Diagnosis present

## 2016-07-25 DIAGNOSIS — E785 Hyperlipidemia, unspecified: Secondary | ICD-10-CM | POA: Diagnosis present

## 2016-07-25 DIAGNOSIS — I10 Essential (primary) hypertension: Secondary | ICD-10-CM | POA: Diagnosis present

## 2016-07-25 DIAGNOSIS — Z7983 Long term (current) use of bisphosphonates: Secondary | ICD-10-CM

## 2016-07-25 DIAGNOSIS — C772 Secondary and unspecified malignant neoplasm of intra-abdominal lymph nodes: Secondary | ICD-10-CM | POA: Diagnosis present

## 2016-07-25 DIAGNOSIS — R109 Unspecified abdominal pain: Secondary | ICD-10-CM | POA: Diagnosis not present

## 2016-07-25 DIAGNOSIS — R112 Nausea with vomiting, unspecified: Secondary | ICD-10-CM | POA: Diagnosis present

## 2016-07-25 DIAGNOSIS — K5669 Other intestinal obstruction: Secondary | ICD-10-CM

## 2016-07-25 DIAGNOSIS — D3A019 Benign carcinoid tumor of the small intestine, unspecified portion: Secondary | ICD-10-CM | POA: Diagnosis present

## 2016-07-25 DIAGNOSIS — E039 Hypothyroidism, unspecified: Secondary | ICD-10-CM | POA: Diagnosis present

## 2016-07-25 DIAGNOSIS — R1010 Upper abdominal pain, unspecified: Secondary | ICD-10-CM | POA: Diagnosis present

## 2016-07-25 DIAGNOSIS — C7A1 Malignant poorly differentiated neuroendocrine tumors: Secondary | ICD-10-CM | POA: Diagnosis not present

## 2016-07-25 HISTORY — DX: Benign carcinoid tumor of the small intestine, unspecified portion: D3A.019

## 2016-07-25 HISTORY — DX: Hypothyroidism, unspecified: E03.9

## 2016-07-25 LAB — CBC WITH DIFFERENTIAL/PLATELET
BASOS PCT: 0 %
Basophils Absolute: 0 10*3/uL (ref 0.0–0.1)
EOS ABS: 0 10*3/uL (ref 0.0–0.7)
Eosinophils Relative: 0 %
HEMATOCRIT: 46.1 % — AB (ref 36.0–46.0)
HEMOGLOBIN: 14.7 g/dL (ref 12.0–15.0)
LYMPHS ABS: 1.1 10*3/uL (ref 0.7–4.0)
Lymphocytes Relative: 10 %
MCH: 29.8 pg (ref 26.0–34.0)
MCHC: 31.9 g/dL (ref 30.0–36.0)
MCV: 93.3 fL (ref 78.0–100.0)
MONO ABS: 0.3 10*3/uL (ref 0.1–1.0)
MONOS PCT: 2 %
NEUTROS PCT: 88 %
Neutro Abs: 10.2 10*3/uL — ABNORMAL HIGH (ref 1.7–7.7)
Platelets: 406 10*3/uL — ABNORMAL HIGH (ref 150–400)
RBC: 4.94 MIL/uL (ref 3.87–5.11)
RDW: 12.8 % (ref 11.5–15.5)
WBC: 11.6 10*3/uL — ABNORMAL HIGH (ref 4.0–10.5)

## 2016-07-25 LAB — URINALYSIS, ROUTINE W REFLEX MICROSCOPIC
BILIRUBIN URINE: NEGATIVE
Glucose, UA: NEGATIVE mg/dL
Hgb urine dipstick: NEGATIVE
Ketones, ur: NEGATIVE mg/dL
NITRITE: NEGATIVE
Protein, ur: NEGATIVE mg/dL
Specific Gravity, Urine: 1.044 — ABNORMAL HIGH (ref 1.005–1.030)
pH: 8.5 — ABNORMAL HIGH (ref 5.0–8.0)

## 2016-07-25 LAB — I-STAT CHEM 8, ED
BUN: 17 mg/dL (ref 6–20)
CALCIUM ION: 1.15 mmol/L (ref 1.12–1.23)
CREATININE: 0.8 mg/dL (ref 0.44–1.00)
Chloride: 104 mmol/L (ref 101–111)
Glucose, Bld: 116 mg/dL — ABNORMAL HIGH (ref 65–99)
HCT: 46 % (ref 36.0–46.0)
Hemoglobin: 15.6 g/dL — ABNORMAL HIGH (ref 12.0–15.0)
Potassium: 3.7 mmol/L (ref 3.5–5.1)
Sodium: 142 mmol/L (ref 135–145)
TCO2: 27 mmol/L (ref 0–100)

## 2016-07-25 LAB — PROTIME-INR
INR: 1
Prothrombin Time: 13.2 seconds (ref 11.4–15.2)

## 2016-07-25 LAB — COMPREHENSIVE METABOLIC PANEL
ALK PHOS: 56 U/L (ref 38–126)
ALT: 20 U/L (ref 14–54)
ANION GAP: 7 (ref 5–15)
AST: 26 U/L (ref 15–41)
Albumin: 3.8 g/dL (ref 3.5–5.0)
BILIRUBIN TOTAL: 0.5 mg/dL (ref 0.3–1.2)
BUN: 15 mg/dL (ref 6–20)
CALCIUM: 9.3 mg/dL (ref 8.9–10.3)
CO2: 28 mmol/L (ref 22–32)
Chloride: 106 mmol/L (ref 101–111)
Creatinine, Ser: 0.88 mg/dL (ref 0.44–1.00)
GFR calc non Af Amer: >60 mL/min (ref >60)
Glucose, Bld: 117 mg/dL — ABNORMAL HIGH (ref 65–99)
POTASSIUM: 3.8 mmol/L (ref 3.5–5.1)
SODIUM: 141 mmol/L (ref 135–145)
TOTAL PROTEIN: 6.9 g/dL (ref 6.5–8.1)

## 2016-07-25 LAB — LIPASE, BLOOD: Lipase: 26 U/L (ref 11–51)

## 2016-07-25 LAB — URINE MICROSCOPIC-ADD ON

## 2016-07-25 LAB — TYPE AND SCREEN
ABO/RH(D): A POS
ANTIBODY SCREEN: NEGATIVE

## 2016-07-25 LAB — TSH: TSH: 0.621 u[IU]/mL (ref 0.350–4.500)

## 2016-07-25 LAB — APTT: aPTT: 30 seconds (ref 24–36)

## 2016-07-25 LAB — ABO/RH: ABO/RH(D): A POS

## 2016-07-25 MED ORDER — MORPHINE SULFATE (PF) 2 MG/ML IV SOLN: 2.0000 mg | INTRAVENOUS | Status: DC | PRN

## 2016-07-25 MED ORDER — IOPAMIDOL (ISOVUE-300) INJECTION 61%
INTRAVENOUS | Status: AC
  Administered 2016-07-25: 100 mL
  Filled 2016-07-25: qty 100

## 2016-07-25 MED ORDER — SODIUM CHLORIDE 0.9 % IV BOLUS (SEPSIS)
1000.0000 mL | Freq: Once | INTRAVENOUS | Status: AC
  Administered 2016-07-25: 1000 mL via INTRAVENOUS

## 2016-07-25 MED ORDER — ACETAMINOPHEN 325 MG PO TABS
650.0000 mg | ORAL_TABLET | Freq: Four times a day (QID) | ORAL | Status: DC | PRN
Start: 2016-07-25 — End: 2016-08-02

## 2016-07-25 MED ORDER — AZELASTINE HCL 0.1 % NA SOLN
1.0000 | Freq: Two times a day (BID) | NASAL | Status: DC | PRN
  Filled 2016-07-25 (×2): qty 30

## 2016-07-25 MED ORDER — KCL IN DEXTROSE-NACL 20-5-0.45 MEQ/L-%-% IV SOLN
INTRAVENOUS | Status: DC
  Administered 2016-07-25 – 2016-07-28 (×6): via INTRAVENOUS
  Administered 2016-07-28: 1000 mL via INTRAVENOUS
  Administered 2016-07-28: 12:00:00 via INTRAVENOUS
  Filled 2016-07-25 (×12): qty 1000

## 2016-07-25 MED ORDER — SODIUM CHLORIDE 0.9 % IV SOLN
INTRAVENOUS | Status: DC
  Administered 2016-07-25: 09:00:00 via INTRAVENOUS

## 2016-07-25 MED ORDER — HYDRALAZINE HCL 20 MG/ML IJ SOLN
5.0000 mg | INTRAMUSCULAR | Status: DC | PRN
Start: 2016-07-25 — End: 2016-07-26

## 2016-07-25 MED ORDER — ALBUTEROL SULFATE (2.5 MG/3ML) 0.083% IN NEBU: 2.5000 mg | INHALATION_SOLUTION | Freq: Four times a day (QID) | RESPIRATORY_TRACT | Status: DC | PRN

## 2016-07-25 MED ORDER — LEVOTHYROXINE SODIUM 100 MCG IV SOLR
25.0000 ug | Freq: Every day | INTRAVENOUS | Status: DC
  Administered 2016-07-25 – 2016-07-31 (×6): 25 ug via INTRAVENOUS
  Filled 2016-07-25 (×6): qty 5

## 2016-07-25 MED ORDER — ACETAMINOPHEN 650 MG RE SUPP: 650.0000 mg | Freq: Four times a day (QID) | RECTAL | Status: DC | PRN

## 2016-07-25 MED ORDER — ONDANSETRON HCL 4 MG/2ML IJ SOLN: 4.0000 mg | Freq: Three times a day (TID) | INTRAMUSCULAR | Status: DC | PRN

## 2016-07-25 NOTE — ED Notes (Signed)
EMS reported VS 98.6 (oral)-74-16 148/72 97% RA CBG 104

## 2016-07-25 NOTE — ED Triage Notes (Signed)
Patient presents from home via Anne Stark with c/o N/V/D that started last night about 830p

## 2016-07-25 NOTE — Consult Note (Signed)
Reason for Consult: abdominal pain Referring Physician:  Short, Anne Stark is an 65 y.o. female.  HPI: 66 yo female presents with 1 day of diffuse abdominal pain, nausea and diarrhea. She has bouts similar to this about 1x/year. She has no recent travel, no foods, or sick contacts. She denies fevers. She does not smoke or drink. She had a colonoscopy 2 years ago that was negative. She has no previous abdominal surgery  Past Medical History:  Diagnosis Date  . Hypertension   . Hypothyroidism   . Thyroid disease     Past Surgical History:  Procedure Laterality Date  . HEMORRHOID SURGERY      Family History  Problem Relation Age of Onset  . Breast cancer Mother   . Heart disease Mother   . COPD Father   . Breast cancer Sister     Social History:  reports that she has never smoked. She has never used smokeless tobacco. She reports that she does not drink alcohol or use drugs.  Allergies: No Known Allergies  Medications: I have reviewed the patient's current medications.  Results for orders placed or performed during the hospital encounter of 07/25/16 (from the past 48 hour(s))  CBC with Differential     Status: Abnormal   Collection Time: 07/25/16  3:37 AM  Result Value Ref Range   WBC 11.6 (H) 4.0 - 10.5 K/uL   RBC 4.94 3.87 - 5.11 MIL/uL   Hemoglobin 14.7 12.0 - 15.0 g/dL   HCT 46.1 (H) 36.0 - 46.0 %   MCV 93.3 78.0 - 100.0 fL   MCH 29.8 26.0 - 34.0 pg   MCHC 31.9 30.0 - 36.0 g/dL   RDW 12.8 11.5 - 15.5 %   Platelets 406 (H) 150 - 400 K/uL   Neutrophils Relative % 88 %   Neutro Abs 10.2 (H) 1.7 - 7.7 K/uL   Lymphocytes Relative 10 %   Lymphs Abs 1.1 0.7 - 4.0 K/uL   Monocytes Relative 2 %   Monocytes Absolute 0.3 0.1 - 1.0 K/uL   Eosinophils Relative 0 %   Eosinophils Absolute 0.0 0.0 - 0.7 K/uL   Basophils Relative 0 %   Basophils Absolute 0.0 0.0 - 0.1 K/uL  Comprehensive metabolic panel     Status: Abnormal   Collection Time: 07/25/16  3:37 AM   Result Value Ref Range   Sodium 141 135 - 145 mmol/L   Potassium 3.8 3.5 - 5.1 mmol/L   Chloride 106 101 - 111 mmol/L   CO2 28 22 - 32 mmol/L   Glucose, Bld 117 (H) 65 - 99 mg/dL   BUN 15 6 - 20 mg/dL   Creatinine, Ser 0.88 0.44 - 1.00 mg/dL   Calcium 9.3 8.9 - 10.3 mg/dL   Total Protein 6.9 6.5 - 8.1 g/dL   Albumin 3.8 3.5 - 5.0 g/dL   AST 26 15 - 41 U/L   ALT 20 14 - 54 U/L   Alkaline Phosphatase 56 38 - 126 U/L   Total Bilirubin 0.5 0.3 - 1.2 mg/dL   GFR calc non Af Amer >60 >60 mL/min   GFR calc Af Amer >60 >60 mL/min    Comment: (NOTE) The eGFR has been calculated using the CKD EPI equation. This calculation has not been validated in all clinical situations. eGFR's persistently <60 mL/min signify possible Chronic Kidney Disease.    Anion gap 7 5 - 15  Lipase, blood     Status: None   Collection Time:  07/25/16  3:37 AM  Result Value Ref Range   Lipase 26 11 - 51 U/L  I-Stat Chem 8, ED     Status: Abnormal   Collection Time: 07/25/16  3:48 AM  Result Value Ref Range   Sodium 142 135 - 145 mmol/L   Potassium 3.7 3.5 - 5.1 mmol/L   Chloride 104 101 - 111 mmol/L   BUN 17 6 - 20 mg/dL   Creatinine, Ser 0.80 0.44 - 1.00 mg/dL   Glucose, Bld 116 (H) 65 - 99 mg/dL   Calcium, Ion 1.15 1.12 - 1.23 mmol/L   TCO2 27 0 - 100 mmol/L   Hemoglobin 15.6 (H) 12.0 - 15.0 g/dL   HCT 46.0 36.0 - 46.0 %  Type and screen [place]     Status: None   Collection Time: 07/25/16  6:21 AM  Result Value Ref Range   ABO/RH(D) A POS    Antibody Screen NEG    Sample Expiration 07/28/2016   ABO/Rh     Status: None (Preliminary result)   Collection Time: 07/25/16  6:21 AM  Result Value Ref Range   ABO/RH(D) A POS   Urinalysis, Routine w reflex microscopic (not at [facility])     Status: Abnormal   Collection Time: 07/25/16  6:32 AM  Result Value Ref Range   Color, Urine YELLOW YELLOW   APPearance CLEAR CLEAR   Specific Gravity, Urine 1.044 (H) 1.005 - 1.030   pH 8.5 (H) 5.0  - 8.0   Glucose, UA NEGATIVE NEGATIVE mg/dL   Hgb urine dipstick NEGATIVE NEGATIVE   Bilirubin Urine NEGATIVE NEGATIVE   Ketones, ur NEGATIVE NEGATIVE mg/dL   Protein, ur NEGATIVE NEGATIVE mg/dL   Nitrite NEGATIVE NEGATIVE   Leukocytes, UA SMALL (A) NEGATIVE  Urine microscopic-add on     Status: Abnormal   Collection Time: 07/25/16  6:32 AM  Result Value Ref Range   Squamous Epithelial / LPF 0-5 (A) NONE SEEN   WBC, UA 6-30 0 - 5 WBC/hpf   RBC / HPF 0-5 0 - 5 RBC/hpf   Bacteria, UA FEW (A) NONE SEEN    Ct Abdomen Pelvis W Contrast  Result Date: 07/25/2016 CLINICAL DATA:  Sudden onset of mid abdominal pain radiating to the back. EXAM: CT ABDOMEN AND PELVIS WITH CONTRAST TECHNIQUE: Multidetector CT imaging of the abdomen and pelvis was performed using the standard protocol following bolus administration of intravenous contrast. CONTRAST:  100 cc Isovue-300 IV COMPARISON:  CT 08/28/2009 FINDINGS: Lower chest: Dependent ground-glass opacities in the lung bases consistent with atelectasis. No pleural fluid. Liver: No focal lesion. Trace subdiaphragmatic perihepatic fluid is unchanged from prior exam. Hepatobiliary: Gallbladder physiologically distended, no calcified stone. No biliary dilatation. Pancreas: No ductal dilatation or inflammation. Spleen: Normal. Adrenal glands: No nodule. Kidneys: Symmetric renal enhancement and excretion. No hydronephrosis. No perinephric edema. Stomach/Bowel: Small hiatal hernia. Proximal small bowel loops are nondilated. More distal pelvic small bowel loops are dilated and fluid-filled with fecalization of small bowel contents and abrupt transition point in the left mid abdomen best appreciated on coronal reformats image 28 series 4. There is a questionable enhancing focus at the site of transition. There is mesenteric edema small volume of free fluid. No pneumatosis or free air. Majority of the colon is decompressed, small volume of colonic stool in the ascending  colon. Appendix tentatively identified and normal. Vascular/Lymphatic: No retroperitoneal adenopathy. Abdominal aorta is normal in caliber. Mild for age atherosclerosis without aneurysm. Reproductive: The uterus appears normal.  No adnexal mass. The ovaries are not well-defined. Prominent left adnexal vascularity and dilated left ovarian veins, similar to prior exam. Bladder: Physiologically distended without wall thickening. Other: No intra-abdominal abscess or free air. Free fluid in the lower abdomen and pelvis. Tiny fat containing umbilical hernia. Musculoskeletal: Compression fracture of T12, new from CT 2010 but appears chronic. There is degenerative change in the lumbar spine with primarily facet arthropathy. There are no acute or suspicious osseous abnormalities. IMPRESSION: 1. Small bowel obstruction with transition point in the left mid abdomen. Questionable enhancing focus at the site of obstruction which may be mesenteric vessels, however possibility of small bowel mass or AVM is raised. 2. Similar findings of prominent left adnexal vascularity and dilatation of the ovarian veins, suggesting pelvic congestion syndrome. Electronically Signed   By: Jeb Levering M.D.   On: 07/25/2016 05:13    Review of Systems  Constitutional: Negative for chills and fever.  HENT: Negative for hearing loss.   Eyes: Negative for blurred vision and double vision.  Respiratory: Negative for cough and hemoptysis.   Cardiovascular: Negative for chest pain and palpitations.  Gastrointestinal: Positive for abdominal pain, diarrhea, nausea and vomiting.  Genitourinary: Negative for dysuria and urgency.  Musculoskeletal: Negative for myalgias and neck pain.  Skin: Negative for itching and rash.  Neurological: Negative for dizziness, tingling and headaches.  Endo/Heme/Allergies: Does not bruise/bleed easily.  Psychiatric/Behavioral: Negative for depression and suicidal ideas.   Blood pressure 160/87, pulse 80,  temperature 98.2 F (36.8 C), temperature source Oral, resp. rate 19, height 5' (1.524 m), weight 67.1 kg (148 lb), SpO2 98 %. Physical Exam  Nursing note and vitals reviewed. Constitutional: She is oriented to person, place, and time. She appears well-developed and well-nourished.  HENT:  Head: Normocephalic and atraumatic.  Eyes: Conjunctivae and EOM are normal. No scleral icterus.  Neck: Normal range of motion. Neck supple.  Cardiovascular: Normal rate and regular rhythm.   Respiratory: Effort normal and breath sounds normal. She has no wheezes. She has no rales. She exhibits no tenderness.  GI: Soft. She exhibits no distension. There is no tenderness. There is no rebound.  Pain on left side palpation  Musculoskeletal: Normal range of motion. She exhibits no edema.  Neurological: She is alert and oriented to person, place, and time.  Skin: Skin is warm and dry.  Psychiatric: She has a normal mood and affect. Her behavior is normal.    Assessment/Plan: 65 yo female with recurrent bouts of nausea/abdominal pain/diarrhea. On work up she shows no leukocytosis, no peritoneal signs. Her CT is significant for an area of the mid jejunum with focal dilation and fecalization just proximal to a thickened loop of intestine, which could represent tumor vs stricture vs AVM/mesenteric vessel.  -I discussed these findings with the patient, specifically that this could be a partial narrowing of her intestine, that small bowel tumors are rare and frequently benign, but that this could also relate to some of her symptoms and exploration and resection of the area may be worth while both for oncologic possibilities and symptomatic benefits. -At this time surgery will follow along, I do not think she needs urgent or emergent operation, and if other symptoms resolved this could be performed electively -Patient is hesitant about undergoing surgery because she will be caring for her daughter at the end of October  for the birth of her second grandchild  Arta Bruce Ruford Dudzinski 07/25/2016, 7:29 AM

## 2016-07-25 NOTE — ED Provider Notes (Signed)
[facility] DE[facility] Provider Note   CSN: [id number] Arrival date & time: 07/25/16  [id number]  By signing my name below, I, Irene Pap, attest that this documentation has been prepared under the direction and in the presence of Deno Etienne, DO. Electronically Signed: Irene Pap, ED Scribe. 07/25/16. 3:44 AM.  History   Chief Complaint Chief Complaint  Patient presents with  . Abdominal Pain  . Nausea  . Emesis  . Diarrhea   The history is provided by the patient. No language interpreter was used.  Abdominal Pain   This is a new problem. The current episode started 6 to 12 hours ago. The problem occurs constantly. The problem has been gradually worsening. The pain is located in the generalized abdominal region. The quality of the pain is cramping. The pain is moderate. Associated symptoms include diarrhea, nausea and vomiting. Pertinent negatives include fever, dysuria, headaches, arthralgias and myalgias.  Emesis   Associated symptoms include abdominal pain and diarrhea. Pertinent negatives include no arthralgias, no chills, no fever, no headaches and no myalgias.  Diarrhea   Associated symptoms include abdominal pain and vomiting. Pertinent negatives include no chills, no headaches, no arthralgias and no myalgias.  HPI Comments: Alvilda Engelsman is a 65 y.o. female with a hx of HTN who presents to the Emergency Department complaining of sudden onset, gradually worsening, cramping, diffuse abdominal pain onset 7 hours ago. Pt reports associated nausea, food material emesis, and diarrhea. Pt reports hx of similar symptoms but is not regularly followed by GI and does not take regular medications. Pt has not taken anything for her symptoms. She denies hx of abdominal surgeries. She denies fever, chills, hematemesis, or hematochezia.   Past Medical History:  Diagnosis Date  . Hypertension   . Hypothyroidism   . Thyroid disease     Patient Active Problem List   Diagnosis Date Noted    . HLD (hyperlipidemia) 07/25/2016  . Essential hypertension 07/25/2016  . Abdominal pain 07/25/2016  . SBO (small bowel obstruction) ([place]) 07/25/2016  . Nausea & vomiting 07/25/2016  . Hypothyroidism     Past Surgical History:  Procedure Laterality Date  . BOWEL RESECTION N/A 07/27/2016   Procedure: SMALL BOWEL RESECTION;  Surgeon: Ralene Ok, MD;  Location: [facility];  Service: General;  Laterality: N/A;  . HEMORRHOID SURGERY    . LAPAROSCOPY N/A 07/27/2016   Procedure: LAPAROSCOPY DIAGNOSTIC;  Surgeon: Ralene Ok, MD;  Location: [facility];  Service: General;  Laterality: N/A;  . LAPAROTOMY N/A 07/27/2016   Procedure: EXPLORATORY LAPAROTOMY;  Surgeon: Ralene Ok, MD;  Location: [facility];  Service: General;  Laterality: N/A;    OB History    No data available       Home Medications    Prior to Admission medications   Medication Sig Start Date End Date Taking? Authorizing Provider  albuterol (PROVENTIL HFA;VENTOLIN HFA) 108 (90 BASE) MCG/ACT inhaler Inhale 2 puffs into the lungs every 6 (six) hours as needed. For shortness of breath or wheeze   Yes Historical Provider, MD  alendronate (FOSAMAX) 70 MG tablet Take 70 mg by mouth every 7 (seven) days. Take with a full glass of water on an empty stomach.  On Sunday   Yes Historical Provider, MD  aspirin EC 81 MG tablet Take 81 mg by mouth daily.   Yes Historical Provider, MD  atorvastatin (LIPITOR) 10 MG tablet Take 10 mg by mouth daily.   Yes Historical Provider, MD  azelastine (ASTELIN) 137 MCG/SPRAY nasal spray Place 1  spray into the nose 2 (two) times daily as needed for rhinitis or allergies. Use in each nostril as directed   Yes Historical Provider, MD  furosemide (LASIX) 20 MG tablet Take 20 mg by mouth daily.   Yes Historical Provider, MD  Glucosamine Sulfate 1000 MG CAPS Take 1,000 mg by mouth daily.   Yes Historical Provider, MD  levothyroxine (SYNTHROID, LEVOTHROID) 50 MCG tablet Take 50 mcg by mouth daily before  breakfast.   Yes Historical Provider, MD  loratadine (CLARITIN) 10 MG tablet Take 10 mg by mouth daily.   Yes Historical Provider, MD  Multiple Vitamin (MULTIVITAMIN WITH MINERALS) TABS Take 1 tablet by mouth daily.   Yes Historical Provider, MD  potassium chloride (K-DUR,KLOR-CON) 10 MEQ tablet Take 10 mEq by mouth daily.   Yes Historical Provider, MD    Family History Family History  Problem Relation Age of Onset  . Breast cancer Mother   . Heart disease Mother   . COPD Father   . Breast cancer Sister     Social History Social History  Substance Use Topics  . Smoking status: Never Smoker  . Smokeless tobacco: Never Used  . Alcohol use No     Allergies   Review of patient's allergies indicates no known allergies.   Review of Systems Review of Systems  Constitutional: Negative for chills and fever.  HENT: Negative for congestion and rhinorrhea.   Eyes: Negative for redness and visual disturbance.  Respiratory: Negative for shortness of breath and wheezing.   Cardiovascular: Negative for chest pain and palpitations.  Gastrointestinal: Positive for abdominal pain, diarrhea, nausea and vomiting.  Genitourinary: Negative for dysuria and urgency.  Musculoskeletal: Negative for arthralgias and myalgias.  Skin: Negative for pallor and wound.  Neurological: Negative for dizziness and headaches.     Physical Exam Updated Vital Signs BP (!) 173/82   Pulse 90   Temp 98.2 F (36.8 C) (Oral)   Resp 18   Ht 5' (1.524 m)   Wt 148 lb (67.1 kg)   SpO2 96%   BMI 28.90 kg/m   Physical Exam  Constitutional: She is oriented to person, place, and time. She appears well-developed and well-nourished. No distress.  HENT:  Head: Normocephalic and atraumatic.  Eyes: EOM are normal. Pupils are equal, round, and reactive to light.  Neck: Normal range of motion. Neck supple.  Cardiovascular: Normal rate and regular rhythm.  Exam reveals no gallop and no friction rub.   No murmur  heard. Pulmonary/Chest: Effort normal. She has no wheezes. She has no rales.  Abdominal: Soft. She exhibits no distension. There is tenderness in the right lower quadrant. There is guarding. There is no rebound.  Musculoskeletal: She exhibits no edema or tenderness.  Neurological: She is alert and oriented to person, place, and time.  Skin: Skin is warm and dry. She is not diaphoretic.  Psychiatric: She has a normal mood and affect. Her behavior is normal.  Nursing note and vitals reviewed.    ED Treatments / Results   DIAGNOSTIC STUDIES: Oxygen Saturation is 100% on RA, normal by my interpretation.    COORDINATION OF CARE: 3:41 AM-Discussed treatment plan which includes labs and CT scan with pt at bedside and pt agreed to plan.   Labs (all labs ordered are listed, but only abnormal results are displayed) Labs Reviewed  SURGICAL PCR SCREEN - Abnormal; Notable for the following:       Result Value   Staphylococcus aureus POSITIVE (*)    All  other components within normal limits  CBC WITH DIFFERENTIAL/PLATELET - Abnormal; Notable for the following:    WBC 11.6 (*)    HCT 46.1 (*)    Platelets 406 (*)    Neutro Abs 10.2 (*)    All other components within normal limits  COMPREHENSIVE METABOLIC PANEL - Abnormal; Notable for the following:    Glucose, Bld 117 (*)    All other components within normal limits  URINALYSIS, ROUTINE W REFLEX MICROSCOPIC (NOT AT [facility]) - Abnormal; Notable for the following:    Specific Gravity, Urine 1.044 (*)    pH 8.5 (*)    Leukocytes, UA SMALL (*)    All other components within normal limits  URINE MICROSCOPIC-ADD ON - Abnormal; Notable for the following:    Squamous Epithelial / LPF 0-5 (*)    Bacteria, UA FEW (*)    All other components within normal limits  BASIC METABOLIC PANEL - Abnormal; Notable for the following:    Glucose, Bld 108 (*)    Calcium 7.7 (*)    All other components within normal limits  CBC - Abnormal; Notable for the  following:    Hemoglobin 11.7 (*)    All other components within normal limits  GLUCOSE, CAPILLARY - Abnormal; Notable for the following:    Glucose-Capillary 102 (*)    All other components within normal limits  CBC - Abnormal; Notable for the following:    WBC 12.7 (*)    All other components within normal limits  COMPREHENSIVE METABOLIC PANEL - Abnormal; Notable for the following:    Glucose, Bld 111 (*)    BUN 5 (*)    Calcium 8.7 (*)    Total Protein 6.4 (*)    Albumin 3.2 (*)    All other components within normal limits  BASIC METABOLIC PANEL - Abnormal; Notable for the following:    Potassium 3.3 (*)    Glucose, Bld 117 (*)    Calcium 8.2 (*)    All other components within normal limits  I-STAT CHEM 8, ED - Abnormal; Notable for the following:    Glucose, Bld 116 (*)    Hemoglobin 15.6 (*)    All other components within normal limits  LIPASE, BLOOD  TSH  PROTIME-INR  APTT  GLUCOSE, CAPILLARY  GLUCOSE, CAPILLARY  GLUCOSE, CAPILLARY  MAGNESIUM  TYPE AND SCREEN  ABO/RH  SURGICAL PATHOLOGY    EKG  EKG Interpretation  Date/Time:  Sunday July 25 2016 03:30:53 EDT Ventricular Rate:  69 PR Interval:    QRS Duration: 79 QT Interval:  406 QTC Calculation: 435 R Axis:   81 Text Interpretation:  Sinus rhythm Left atrial enlargement Borderline right axis deviation Minimal ST depression, inferior leads ? st depression inferiorly though some baseline wander No old tracing to compare Confirmed by Kallon Caylor MD, DANIEL [id number]) on 07/25/2016 3:53:48 AM       Radiology No results found.  Procedures Procedures (including critical care time)  Medications Ordered in ED Medications  azelastine (ASTELIN) 0.1 % nasal spray 1 spray ( Each Nare MAR [place] 07/27/16 1146)  levothyroxine (SYNTHROID, LEVOTHROID) injection 25 mcg (25 mcg Intravenous Given 07/30/16 1057)  ondansetron (ZOFRAN) injection 4 mg ( Intravenous MAR [place] 07/27/16 1146)  morphine 2 MG/ML injection 2 mg (  Intravenous MAR [place] 07/27/16 1146)  albuterol (PROVENTIL) (2.5 MG/3ML) 0.083% nebulizer solution 2.5 mg ( Nebulization MAR [place] 07/27/16 1146)  acetaminophen (TYLENOL) tablet 650 mg ( Oral MAR [place] 07/27/16 1146)    Or  acetaminophen (TYLENOL) suppository 650 mg ( Rectal MAR [place] 07/27/16 1146)  hydrALAZINE (APRESOLINE) injection 10 mg (10 mg Intravenous Given 07/29/16 0630)  mupirocin ointment (BACTROBAN) 2 % 1 application (1 application Nasal Given 07/30/16 1000)  Chlorhexidine Gluconate Cloth 2 % PADS 6 each (6 each Topical Given by Other 07/30/16 1437)  enoxaparin (LOVENOX) injection 40 mg (40 mg Subcutaneous Given 07/29/16 2204)  dextrose 5 %-0.9 % sodium chloride infusion (1,000 mLs Intravenous New Bag/Given 07/30/16 0424)  HYDROmorphone (DILAUDID) injection 1-2 mg (1 mg Intravenous Given 07/30/16 0420)  oxyCODONE-acetaminophen (PERCOCET/ROXICET) 5-325 MG per tablet 1-2 tablet (not administered)  ondansetron (ZOFRAN-ODT) disintegrating tablet 4 mg (not administered)    Or  ondansetron (ZOFRAN) injection 4 mg (not administered)  HYDROmorphone (DILAUDID) 1 MG/ML injection (not administered)  HYDROmorphone (DILAUDID) 1 MG/ML injection (not administered)  MEDLINE mouth rinse (15 mLs Mouth Rinse Given 07/30/16 1000)  potassium chloride 10 mEq in 100 mL IVPB (10 mEq Intravenous Given 07/30/16 1434)  sodium chloride 0.9 % bolus 1,000 mL (0 mLs Intravenous Stopped 07/25/16 0631)  iopamidol (ISOVUE-300) 61 % injection (100 mLs  Contrast Given 07/25/16 0430)  cefoTEtan (CEFOTAN) 2 g in dextrose 5 % 50 mL IVPB (2 g Intravenous Given 07/27/16 2225)  acetaminophen (OFIRMEV) 10 MG/ML IV (1,000 mg  Given 07/27/16 1035)  acetaminophen (OFIRMEV) IV 1,000 mg (1,000 mg Intravenous Given 07/28/16 1000)     Initial Impression / Assessment and Plan / ED Course  I have reviewed the triage vital signs and the nursing notes.  Pertinent labs & imaging results that were available during my care of the patient were  reviewed by me and considered in my medical decision making (see chart for details).  Clinical Course    65 yo F With a chief complaints of abdominal pain and vomiting. Patient has had no flatus. Patient has had multiple episodes of this in the past one was considered a bowel obstruction which resolved on its own. CT scan of the abdomen pelvis concerning for a small bowel obstruction with some concern versus mass or AVM. Discussed with the patient will admit.  The patients results and plan were reviewed and discussed.   Any x-rays performed were independently reviewed by myself.   Differential diagnosis were considered with the presenting HPI.  Medications  azelastine (ASTELIN) 0.1 % nasal spray 1 spray ( Each Nare MAR [place] 07/27/16 1146)  levothyroxine (SYNTHROID, LEVOTHROID) injection 25 mcg (25 mcg Intravenous Given 07/30/16 1057)  ondansetron (ZOFRAN) injection 4 mg ( Intravenous MAR [place] 07/27/16 1146)  morphine 2 MG/ML injection 2 mg ( Intravenous MAR [place] 07/27/16 1146)  albuterol (PROVENTIL) (2.5 MG/3ML) 0.083% nebulizer solution 2.5 mg ( Nebulization MAR [place] 07/27/16 1146)  acetaminophen (TYLENOL) tablet 650 mg ( Oral MAR [place] 07/27/16 1146)    Or  acetaminophen (TYLENOL) suppository 650 mg ( Rectal MAR [place] 07/27/16 1146)  hydrALAZINE (APRESOLINE) injection 10 mg (10 mg Intravenous Given 07/29/16 0630)  mupirocin ointment (BACTROBAN) 2 % 1 application (1 application Nasal Given 07/30/16 1000)  Chlorhexidine Gluconate Cloth 2 % PADS 6 each (6 each Topical Given by Other 07/30/16 1437)  enoxaparin (LOVENOX) injection 40 mg (40 mg Subcutaneous Given 07/29/16 2204)  dextrose 5 %-0.9 % sodium chloride infusion (1,000 mLs Intravenous New Bag/Given 07/30/16 0424)  HYDROmorphone (DILAUDID) injection 1-2 mg (1 mg Intravenous Given 07/30/16 0420)  oxyCODONE-acetaminophen (PERCOCET/ROXICET) 5-325 MG per tablet 1-2 tablet (not administered)  ondansetron (ZOFRAN-ODT) disintegrating tablet 4 mg  (not administered)    Or  ondansetron (  ZOFRAN) injection 4 mg (not administered)  HYDROmorphone (DILAUDID) 1 MG/ML injection (not administered)  HYDROmorphone (DILAUDID) 1 MG/ML injection (not administered)  MEDLINE mouth rinse (15 mLs Mouth Rinse Given 07/30/16 1000)  potassium chloride 10 mEq in 100 mL IVPB (10 mEq Intravenous Given 07/30/16 1434)  sodium chloride 0.9 % bolus 1,000 mL (0 mLs Intravenous Stopped 07/25/16 0631)  iopamidol (ISOVUE-300) 61 % injection (100 mLs  Contrast Given 07/25/16 0430)  cefoTEtan (CEFOTAN) 2 g in dextrose 5 % 50 mL IVPB (2 g Intravenous Given 07/27/16 2225)  acetaminophen (OFIRMEV) 10 MG/ML IV (1,000 mg  Given 07/27/16 1035)  acetaminophen (OFIRMEV) IV 1,000 mg (1,000 mg Intravenous Given 07/28/16 1000)    Vitals:   07/29/16 1322 07/29/16 2151 07/30/16 0539 07/30/16 1411  BP: (!) 155/78 (!) 141/67 (!) 150/75 (!) 173/82  Pulse: 91 80 75 90  Resp: 16 19 18    Temp: 98.5 F (36.9 C) 98.3 F (36.8 C) 98.2 F (36.8 C) 98.2 F (36.8 C)  TempSrc: Oral Oral Oral Oral  SpO2: 97% 93% 97% 96%  Weight:      Height:        Final diagnoses:  Hypothyroidism, unspecified hypothyroidism type    Admission/ observation were discussed with the admitting physician, patient and/or family and they are comfortable with the plan.    Final Clinical Impressions(s) / ED Diagnoses   Final diagnoses:  Hypothyroidism, unspecified hypothyroidism type  SBO (small bowel obstruction) ([place])  I personally performed the services described in this documentation, which was scribed in my presence. The recorded information has been reviewed and is accurate.    New Prescriptions Current Discharge Medication List       Deno Etienne, DO 07/30/16 1455

## 2016-07-25 NOTE — H&P (Signed)
History and Physical    Anne Stark [id number] DOB: 1950/12/15 DOA: 07/25/2016  Referring MD/NP/PA:   PCP: Reginia Naas, MD   Patient coming from:  The patient is coming from home.  At baseline, pt is independent for most of ADL.   Chief Complaint: Abdominal pain, nausea, vomiting  HPI: Anne Stark is a 64 y.o. female with medical history significant of hypertension, hyperlipidemia, hypothyroidism, who presents with nausea, vomiting,  abdominal pain.  Patient started having abdominal pain, nausea, vomiting at about 8:30 PM. The abdominal pain is located in bilateral upper quadrant, just below the rib cage. It is constant, intermittently accentuated, 10 out of 10 at worst time, sharp, nonradiating. No recent antibiotics use. She vomited 3 times and had 3 bowel movements with loose stool. Patient states that her stool is not watery, just loose. Has chill, no fever. No hematemesis or hematochezia. Patient denies chest pain, cough, shortness of breath, symptoms of UTI or unilateral weakness. Of note, patient states that she had similar abdominal pain in the past, which has been happening intermittently in the past 40 years. No clear etiology was found out. She had negative colonoscopy about 4 years ago.  ED Course: pt was found to have WBC 11.6, lipase 26, electrolytes and renal function okay, pending urinalysis, temperature normal, no tachycardia. Pt is admitted to [facility] bed as inpatient for further eval and treatment. General surgeon, Dr. Kieth Brightly was consulted by EDP.  # CT abdomen/pelvis showed small bowel obstruction with transition point in the left mid abdomen. Questionable enhancing focus at the site of obstruction which may be mesenteric vessels, however possibility of small bowel mass or AVM is raised. Similar findings of prominent left adnexal vascularity and dilatation of the ovarian veins, suggesting pelvic congestion syndrome.  Review of Systems:   General: no  fevers, has chills, no changes in body weight, has poor appetite, has fatigue HEENT: no blurry vision, hearing changes or sore throat Respiratory: no dyspnea, coughing, wheezing CV: no chest pain, no palpitations GI: has nausea, vomiting, abdominal pain, loose stool, no constipation GU: no dysuria, burning on urination, increased urinary frequency, hematuria  Ext: no leg edema Neuro: no unilateral weakness, numbness, or tingling, no vision change or hearing loss Skin: no rash MSK: No muscle spasm, no deformity, no limitation of range of movement in spin Heme: No easy bruising.  Travel history: No recent long distant travel.  Allergy: No Known Allergies  Past Medical History:  Diagnosis Date  . Hypertension   . Hypothyroidism   . Thyroid disease     Past Surgical History:  Procedure Laterality Date  . HEMORRHOID SURGERY      Social History:  reports that she has never smoked. She has never used smokeless tobacco. She reports that she does not drink alcohol or use drugs.  Family History:  Family History  Problem Relation Age of Onset  . Breast cancer Mother   . Heart disease Mother   . COPD Father   . Breast cancer Sister      Prior to Admission medications   Medication Sig Start Date End Date Taking? Authorizing Provider  albuterol (PROVENTIL HFA;VENTOLIN HFA) 108 (90 BASE) MCG/ACT inhaler Inhale 2 puffs into the lungs every 6 (six) hours as needed. For shortness of breath or wheeze   Yes Historical Provider, MD  alendronate (FOSAMAX) 70 MG tablet Take 70 mg by mouth every 7 (seven) days. Take with a full glass of water on an empty stomach.  On Sunday  Yes Historical Provider, MD  aspirin EC 81 MG tablet Take 81 mg by mouth daily.   Yes Historical Provider, MD  atorvastatin (LIPITOR) 10 MG tablet Take 10 mg by mouth daily.   Yes Historical Provider, MD  azelastine (ASTELIN) 137 MCG/SPRAY nasal spray Place 1 spray into the nose 2 (two) times daily as needed for rhinitis or  allergies. Use in each nostril as directed   Yes Historical Provider, MD  furosemide (LASIX) 20 MG tablet Take 20 mg by mouth daily.   Yes Historical Provider, MD  Glucosamine Sulfate 1000 MG CAPS Take 1,000 mg by mouth daily.   Yes Historical Provider, MD  levothyroxine (SYNTHROID, LEVOTHROID) 50 MCG tablet Take 50 mcg by mouth daily before breakfast.   Yes Historical Provider, MD  loratadine (CLARITIN) 10 MG tablet Take 10 mg by mouth daily.   Yes Historical Provider, MD  Multiple Vitamin (MULTIVITAMIN WITH MINERALS) TABS Take 1 tablet by mouth daily.   Yes Historical Provider, MD  potassium chloride (K-DUR,KLOR-CON) 10 MEQ tablet Take 10 mEq by mouth daily.   Yes Historical Provider, MD    Physical Exam: Vitals:   07/25/16 0545 07/25/16 0600 07/25/16 0615 07/25/16 0630  BP: 153/81 164/89 147/74 160/87  Pulse: 75 77 72 80  Resp: 14 15 15 19   Temp:      TempSrc:      SpO2: 100% 100% 99% 98%  Weight:      Height:       General: Not in acute distress HEENT:       Eyes: PERRL, EOMI, no scleral icterus.       ENT: No discharge from the ears and nose, no pharynx injection, no tonsillar enlargement.        Neck: No JVD, no bruit, no mass felt. Heme: No neck lymph node enlargement. Cardiac: S1/S2, RRR, No murmurs, No gallops or rubs. Respiratory:  No rales, wheezing, rhonchi or rubs. GI: Soft, nondistended, has tenderness in LUQ and RUQ, no rebound pain, no organomegaly, BS present. GU: No hematuria Ext: No pitting leg edema bilaterally. 2+DP/PT pulse bilaterally. Musculoskeletal: No joint deformities, No joint redness or warmth, no limitation of ROM in spin. Skin: No rashes.  Neuro: Alert, oriented X3, cranial nerves II-XII grossly intact, moves all extremities normally.  Labs on Admission: I have personally reviewed following labs and imaging studies  CBC:  Recent Labs Lab 07/25/16 0337 07/25/16 0348  WBC 11.6*  --   NEUTROABS 10.2*  --   HGB 14.7 15.6*  HCT 46.1* 46.0  MCV  93.3  --   PLT 406*  --    Basic Metabolic Panel:  Recent Labs Lab 07/25/16 0337 07/25/16 0348  NA 141 142  K 3.8 3.7  CL 106 104  CO2 28  --   GLUCOSE 117* 116*  BUN 15 17  CREATININE 0.88 0.80  CALCIUM 9.3  --    GFR: Estimated Creatinine Clearance: 59.1 mL/min (by C-G formula based on SCr of 0.8 mg/dL). Liver Function Tests:  Recent Labs Lab 07/25/16 0337  AST 26  ALT 20  ALKPHOS 56  BILITOT 0.5  PROT 6.9  ALBUMIN 3.8    Recent Labs Lab 07/25/16 0337  LIPASE 26   No results for input(s): AMMONIA in the last 168 hours. Coagulation Profile: No results for input(s): INR, PROTIME in the last 168 hours. Cardiac Enzymes: No results for input(s): CKTOTAL, CKMB, CKMBINDEX, TROPONINI in the last 168 hours. BNP (last 3 results) No results for input(s): PROBNP  in the last 8760 hours. HbA1C: No results for input(s): HGBA1C in the last 72 hours. CBG: No results for input(s): GLUCAP in the last 168 hours. Lipid Profile: No results for input(s): CHOL, HDL, LDLCALC, TRIG, CHOLHDL, LDLDIRECT in the last 72 hours. Thyroid Function Tests: No results for input(s): TSH, T4TOTAL, FREET4, T3FREE, THYROIDAB in the last 72 hours. Anemia Panel: No results for input(s): VITAMINB12, FOLATE, FERRITIN, TIBC, IRON, RETICCTPCT in the last 72 hours. Urine analysis:    Component Value Date/Time   COLORURINE YELLOW 11/23/2015 0248   APPEARANCEUR CLOUDY (A) 11/23/2015 0248   LABSPEC 1.021 11/23/2015 0248   PHURINE 8.5 (H) 11/23/2015 0248   GLUCOSEU NEGATIVE 11/23/2015 0248   HGBUR NEGATIVE 11/23/2015 0248   BILIRUBINUR NEGATIVE 11/23/2015 0248   KETONESUR 15 (A) 11/23/2015 0248   PROTEINUR NEGATIVE 11/23/2015 0248   NITRITE NEGATIVE 11/23/2015 0248   LEUKOCYTESUR MODERATE (A) 11/23/2015 0248   Sepsis Labs: @LABRCNTIP (procalcitonin:4,lacticidven:4) )No results found for this or any previous visit (from the past 240 hour(s)).   Radiological Exams on Admission: Ct Abdomen  Pelvis W Contrast  Result Date: 07/25/2016 CLINICAL DATA:  Sudden onset of mid abdominal pain radiating to the back. EXAM: CT ABDOMEN AND PELVIS WITH CONTRAST TECHNIQUE: Multidetector CT imaging of the abdomen and pelvis was performed using the standard protocol following bolus administration of intravenous contrast. CONTRAST:  100 cc Isovue-300 IV COMPARISON:  CT 08/28/2009 FINDINGS: Lower chest: Dependent ground-glass opacities in the lung bases consistent with atelectasis. No pleural fluid. Liver: No focal lesion. Trace subdiaphragmatic perihepatic fluid is unchanged from prior exam. Hepatobiliary: Gallbladder physiologically distended, no calcified stone. No biliary dilatation. Pancreas: No ductal dilatation or inflammation. Spleen: Normal. Adrenal glands: No nodule. Kidneys: Symmetric renal enhancement and excretion. No hydronephrosis. No perinephric edema. Stomach/Bowel: Small hiatal hernia. Proximal small bowel loops are nondilated. More distal pelvic small bowel loops are dilated and fluid-filled with fecalization of small bowel contents and abrupt transition point in the left mid abdomen best appreciated on coronal reformats image 28 series 4. There is a questionable enhancing focus at the site of transition. There is mesenteric edema small volume of free fluid. No pneumatosis or free air. Majority of the colon is decompressed, small volume of colonic stool in the ascending colon. Appendix tentatively identified and normal. Vascular/Lymphatic: No retroperitoneal adenopathy. Abdominal aorta is normal in caliber. Mild for age atherosclerosis without aneurysm. Reproductive: The uterus appears normal. No adnexal mass. The ovaries are not well-defined. Prominent left adnexal vascularity and dilated left ovarian veins, similar to prior exam. Bladder: Physiologically distended without wall thickening. Other: No intra-abdominal abscess or free air. Free fluid in the lower abdomen and pelvis. Tiny fat containing  umbilical hernia. Musculoskeletal: Compression fracture of T12, new from CT 2010 but appears chronic. There is degenerative change in the lumbar spine with primarily facet arthropathy. There are no acute or suspicious osseous abnormalities. IMPRESSION: 1. Small bowel obstruction with transition point in the left mid abdomen. Questionable enhancing focus at the site of obstruction which may be mesenteric vessels, however possibility of small bowel mass or AVM is raised. 2. Similar findings of prominent left adnexal vascularity and dilatation of the ovarian veins, suggesting pelvic congestion syndrome. Electronically Signed   By: Jeb Levering M.D.   On: 07/25/2016 05:13     EKG: Independently reviewed. Sinus rhythm, QTC 435, nonspecific T-wave change.  Assessment/Plan Principal Problem:   SBO (small bowel obstruction) (HCC) Active Problems:   Hypothyroidism   HLD (hyperlipidemia)   Essential hypertension  Abdominal pain   Nausea & vomiting   SBO (small bowel obstruction):  CT abdomen/pelvis showed SBO; and small bowel mass or AVM is also possible, also showed possible pelvic congestion syndrome. Clinically patient is not septic. Hemodynamically stable. General surgeon, Dr. Kieth Brightly was consulted.  -Admit to med surg bed as inpt -NPO  -prn NG tube (not started yet since pt is not actively vomiting) -morphine prn pain -Prn Zofran prn nausea  -IVF: 1L NS and then 100 cc/h -INR/PTT/type & screen -Follow-up general surgeons recommendation  Hypothyroidism: Last TSH was not on record -Continue home Synthroid, but switch oral to IV and cut dose by half, 50 mcg -->25 mcg daily -Check TSH  HLD: Last LDL was not on record -hold home medications: lipitor. Will resume when able to take oral meds  -Check FLP  Hypertension: -Hold oral Lasix -IV hydralazine when necessary  Nausea & vomiting and loose stool: His nausea and vomiting are called by SBO. Etiology for loose stool is not  clear, does not seem to have obvious diarrhea.  -will hold off to check C diff. If develops severe diarrhea--> will check C diff prc -IVF as above -Nausea and the pain control as above    DVT ppx: SCD Code Status: Full code Family Communication: Yes, patient's husband at bed side Disposition Plan:  Anticipate discharge back to previous home environment Consults called:  Education officer, environmental, Dr. Kieth Brightly Admission status: medical floor/inpt  Date of Service 07/25/2016    Ivor Costa [facility] Pager [phone]  If 7PM-7AM, please contact night-coverage [email or web address] Password [facility] 07/25/2016, 6:42 AM

## 2016-07-25 NOTE — Progress Notes (Signed)
Anne Stark [id number] Admission Data: 07/25/2016 10:54 AM Attending Provider: Janece Canterbury, MD  [id number] Weyman Croon, MD Consults/ Treatment Team:   Anne Stark is a 65 y.o. female patient admitted from ED awake, alert  & orientated  X 3,  Full Code, VSS - Blood pressure 127/70, pulse 69, temperature 98.3 F (36.8 C), resp. rate 20, height 5' (1.524 m), weight 67.1 kg (148 lb), SpO2 98 %. no c/o shortness of breath, no c/o chest pain, no distress noted.IV site WDL:  antecubital left, condition patent and no redness with a transparent dsg that's clean dry and intact.  Allergies:  No Known Allergies   Past Medical History:  Diagnosis Date  . Hypertension   . Hypothyroidism   . Thyroid disease     History:  obtained from chart review. Tobacco/alcohol: denied none  Pt orientation to unit, room and routine. Information packet given to patient/family and safety video watched.  Admission INP armband ID verified with patient/family, and in place. SR up x 2, fall risk assessment complete with Patient and family verbalizing understanding of risks associated with falls. Pt verbalizes an understanding of how to use the call bell and to call for help before getting out of bed.  Skin, clean-dry- intact without evidence of bruising, or skin tears.   No evidence of skin break down noted on exam. color normal, vascularity normal, no rashes or suspicious lesions, no evidence of bleeding or bruising, no lesions noted, no rash, no edema, temperature normal, texture normal, mobility and turgor normal    Will cont to monitor and assist as needed.  Anne Slaughter, RN 07/25/2016 10:54 AM

## 2016-07-25 NOTE — Progress Notes (Signed)
PROGRESS NOTE  Anne Stark  [id number] DOB: Jun 22, 1951 DOA: 07/25/2016 PCP: Reginia Naas, MD  Brief Narrative:   Anne Stark is a 65 y.o. female with medical history significant of hypertension, hyperlipidemia, hypothyroidism, who presents with nausea, vomiting,  abdominal pain.  CT demonstrates SBO.  General surgery consulted.    Assessment & Plan:   Principal Problem:   SBO (small bowel obstruction) (HCC) Active Problems:   Hypothyroidism   HLD (hyperlipidemia)   Essential hypertension   Abdominal pain   Nausea & vomiting  SBO (small bowel obstruction) with nausea, vomiting, and loose stools:  CT abdomen/pelvis showed SBO possibly due to a small bowel mass, stricture, or AVM.    - Encouraged activity - morphine prn pain - IVF - appreciate general surgery assistance > if SBO resolves, surgery could be performed on an outpatient basis  Hypothyroidism: TSH 0.621 on 07/25/2016 -  IV levothyroxine  HLD: hold statin while NPO  Hypertension, blood pressure stable -IV hydralazine when necessary  DVT ppx: SCD Code Status: Full code Family Communication: Yes, patient's husband at bed side Disposition Plan:  Anticipate discharge back to previous home environment   Consultants:   General surgery  Procedures:  none  Antimicrobials:   none    Subjective: Having some bloating or abdominal discomfort, but no pain.  Denies current nausea.  Last flatus or BM was last night.     Objective: Vitals:   07/25/16 0600 07/25/16 0615 07/25/16 0630 07/25/16 0756  BP: 164/89 147/74 160/87 127/70  Pulse: 77 72 80 69  Resp: 15 15 19 20   Temp:    98.3 F (36.8 C)  TempSrc:      SpO2: 100% 99% 98% 98%  Weight:      Height:        Intake/Output Summary (Last 24 hours) at 07/25/16 1228 Last data filed at 07/25/16 0631  Gross per 24 hour  Intake             1000 ml  Output              250 ml  Net              750 ml   Filed Weights   07/25/16 0338    Weight: 67.1 kg (148 lb)    Examination:  General exam:  Adult female.  No acute distress.  HEENT:  NCAT, MMM Respiratory system: Clear to auscultation bilaterally Cardiovascular system: Regular rate and rhythm, normal S1/S2. No murmurs, rubs, gallops or clicks.  Warm extremities Gastrointestinal system:  Active bowel sounds but higher pitched, soft, mildly distended, nontender. MSK:  Normal tone and bulk, no lower extremity edema Neuro:  Grossly intact    Data Reviewed: I have personally reviewed following labs and imaging studies  CBC:  Recent Labs Lab 07/25/16 0337 07/25/16 0348  WBC 11.6*  --   NEUTROABS 10.2*  --   HGB 14.7 15.6*  HCT 46.1* 46.0  MCV 93.3  --   PLT 406*  --    Basic Metabolic Panel:  Recent Labs Lab 07/25/16 0337 07/25/16 0348  NA 141 142  K 3.8 3.7  CL 106 104  CO2 28  --   GLUCOSE 117* 116*  BUN 15 17  CREATININE 0.88 0.80  CALCIUM 9.3  --    GFR: Estimated Creatinine Clearance: 59.1 mL/min (by C-G formula based on SCr of 0.8 mg/dL). Liver Function Tests:  Recent Labs Lab 07/25/16 0337  AST 26  ALT 20  ALKPHOS 56  BILITOT 0.5  PROT 6.9  ALBUMIN 3.8    Recent Labs Lab 07/25/16 0337  LIPASE 26   No results for input(s): AMMONIA in the last 168 hours. Coagulation Profile:  Recent Labs Lab 07/25/16 0634  INR 1.00   Cardiac Enzymes: No results for input(s): CKTOTAL, CKMB, CKMBINDEX, TROPONINI in the last 168 hours. BNP (last 3 results) No results for input(s): PROBNP in the last 8760 hours. HbA1C: No results for input(s): HGBA1C in the last 72 hours. CBG: No results for input(s): GLUCAP in the last 168 hours. Lipid Profile: No results for input(s): CHOL, HDL, LDLCALC, TRIG, CHOLHDL, LDLDIRECT in the last 72 hours. Thyroid Function Tests:  Recent Labs  07/25/16 0634  TSH 0.621   Anemia Panel: No results for input(s): VITAMINB12, FOLATE, FERRITIN, TIBC, IRON, RETICCTPCT in the last 72 hours. Urine  analysis:    Component Value Date/Time   COLORURINE YELLOW 07/25/2016 0632   APPEARANCEUR CLEAR 07/25/2016 0632   LABSPEC 1.044 (H) 07/25/2016 0632   PHURINE 8.5 (H) 07/25/2016 0632   GLUCOSEU NEGATIVE 07/25/2016 0632   HGBUR NEGATIVE 07/25/2016 0632   BILIRUBINUR NEGATIVE 07/25/2016 0632   KETONESUR NEGATIVE 07/25/2016 0632   PROTEINUR NEGATIVE 07/25/2016 0632   NITRITE NEGATIVE 07/25/2016 0632   LEUKOCYTESUR SMALL (A) 07/25/2016 0632   Sepsis Labs: @LABRCNTIP (procalcitonin:4,lacticidven:4)  )No results found for this or any previous visit (from the past 240 hour(s)).    Radiology Studies: Ct Abdomen Pelvis W Contrast  Result Date: 07/25/2016 CLINICAL DATA:  Sudden onset of mid abdominal pain radiating to the back. EXAM: CT ABDOMEN AND PELVIS WITH CONTRAST TECHNIQUE: Multidetector CT imaging of the abdomen and pelvis was performed using the standard protocol following bolus administration of intravenous contrast. CONTRAST:  100 cc Isovue-300 IV COMPARISON:  CT 08/28/2009 FINDINGS: Lower chest: Dependent ground-glass opacities in the lung bases consistent with atelectasis. No pleural fluid. Liver: No focal lesion. Trace subdiaphragmatic perihepatic fluid is unchanged from prior exam. Hepatobiliary: Gallbladder physiologically distended, no calcified stone. No biliary dilatation. Pancreas: No ductal dilatation or inflammation. Spleen: Normal. Adrenal glands: No nodule. Kidneys: Symmetric renal enhancement and excretion. No hydronephrosis. No perinephric edema. Stomach/Bowel: Small hiatal hernia. Proximal small bowel loops are nondilated. More distal pelvic small bowel loops are dilated and fluid-filled with fecalization of small bowel contents and abrupt transition point in the left mid abdomen best appreciated on coronal reformats image 28 series 4. There is a questionable enhancing focus at the site of transition. There is mesenteric edema small volume of free fluid. No pneumatosis or free  air. Majority of the colon is decompressed, small volume of colonic stool in the ascending colon. Appendix tentatively identified and normal. Vascular/Lymphatic: No retroperitoneal adenopathy. Abdominal aorta is normal in caliber. Mild for age atherosclerosis without aneurysm. Reproductive: The uterus appears normal. No adnexal mass. The ovaries are not well-defined. Prominent left adnexal vascularity and dilated left ovarian veins, similar to prior exam. Bladder: Physiologically distended without wall thickening. Other: No intra-abdominal abscess or free air. Free fluid in the lower abdomen and pelvis. Tiny fat containing umbilical hernia. Musculoskeletal: Compression fracture of T12, new from CT 2010 but appears chronic. There is degenerative change in the lumbar spine with primarily facet arthropathy. There are no acute or suspicious osseous abnormalities. IMPRESSION: 1. Small bowel obstruction with transition point in the left mid abdomen. Questionable enhancing focus at the site of obstruction which may be mesenteric vessels, however possibility of small bowel mass or AVM is raised. 2. Similar findings  of prominent left adnexal vascularity and dilatation of the ovarian veins, suggesting pelvic congestion syndrome. Electronically Signed   By: Jeb Levering M.D.   On: 07/25/2016 05:13     Scheduled Meds: . levothyroxine  25 mcg Intravenous Daily   Continuous Infusions: . sodium chloride 100 mL/hr at 07/25/16 0836     LOS: 0 days    Time spent: 30 min    Janece Canterbury, MD [facility] Pager [phone]  If 7PM-7AM, please contact night-coverage [email or web address] Password [facility] 07/25/2016, 12:28 PM

## 2016-07-26 ENCOUNTER — Inpatient Hospital Stay (HOSPITAL_COMMUNITY): Payer: Medicare Other

## 2016-07-26 LAB — CBC
HEMATOCRIT: 36.7 % (ref 36.0–46.0)
Hemoglobin: 11.7 g/dL — ABNORMAL LOW (ref 12.0–15.0)
MCH: 30.2 pg (ref 26.0–34.0)
MCHC: 31.9 g/dL (ref 30.0–36.0)
MCV: 94.6 fL (ref 78.0–100.0)
PLATELETS: 301 10*3/uL (ref 150–400)
RBC: 3.88 MIL/uL (ref 3.87–5.11)
RDW: 13.2 % (ref 11.5–15.5)
WBC: 5.8 10*3/uL (ref 4.0–10.5)

## 2016-07-26 LAB — BASIC METABOLIC PANEL
Anion gap: 5 (ref 5–15)
BUN: 7 mg/dL (ref 6–20)
CHLORIDE: 111 mmol/L (ref 101–111)
CO2: 23 mmol/L (ref 22–32)
CREATININE: 0.63 mg/dL (ref 0.44–1.00)
Calcium: 7.7 mg/dL — ABNORMAL LOW (ref 8.9–10.3)
GFR calc Af Amer: >60 mL/min (ref >60)
GFR calc non Af Amer: >60 mL/min (ref >60)
GLUCOSE: 108 mg/dL — AB (ref 65–99)
POTASSIUM: 3.7 mmol/L (ref 3.5–5.1)
SODIUM: 139 mmol/L (ref 135–145)

## 2016-07-26 MED ORDER — HYDRALAZINE HCL 20 MG/ML IJ SOLN
10.0000 mg | INTRAMUSCULAR | Status: DC | PRN
  Administered 2016-07-28 – 2016-07-29 (×2): 10 mg via INTRAVENOUS
  Filled 2016-07-26 (×2): qty 1

## 2016-07-26 NOTE — Progress Notes (Addendum)
PROGRESS NOTE  Anne Stark  [id number] DOB: [birth year] DOA: 07/25/2016 PCP: Reginia Naas, MD  Brief Narrative:   Anne Stark is a 65 y.o. female with medical history significant of hypertension, hyperlipidemia, hypothyroidism, who presents with nausea, vomiting,  abdominal pain.  CT demonstrates SBO.  General surgery consulted.    Assessment & Plan:   Principal Problem:   SBO (small bowel obstruction) (HCC) Active Problems:   Hypothyroidism   HLD (hyperlipidemia)   Essential hypertension   Abdominal pain   Nausea & vomiting  SBO (small bowel obstruction) with nausea, vomiting, and loose stools:  CT abdomen/pelvis showed SBO possibly due to a small bowel mass, stricture, or AVM.    - Encouraged activity - morphine prn pain - IVF -  Advance to [facility] today - NPO at [facility] for surgery for definitive diagnosis and to resolve obstruction  Hypothyroidism: TSH 0.621 on 07/25/2016 -  IV levothyroxine  HLD: hold statin while NPO  Hypertension, blood pressure rising - increase and change parameter for IV hydralazine  DVT ppx: SCD Code Status: Full code Family Communication: Yes, patient alone Disposition Plan:  Anticipate discharge back to previous home environment   Consultants:   General surgery  Procedures:  none  Antimicrobials:   none    Subjective: Flatulence this morning.  Abdominal discomfort improving.  Denies nausea.    Objective: Vitals:   07/25/16 2123 07/26/16 0520 07/26/16 1247 07/26/16 1500  BP: 120/63 124/62 (!) 142/66 (!) 173/77  Pulse: (!) 57 (!) 55 (!) 59 63  Resp: 18 18 18 16   Temp: 98.2 F (36.8 C) 98.2 F (36.8 C) 97.5 F (36.4 C) 98.7 F (37.1 C)  TempSrc: Oral Oral Oral Oral  SpO2: 95% 94% 97% 97%  Weight:      Height:        Intake/Output Summary (Last 24 hours) at 07/26/16 1847 Last data filed at 07/26/16 1300  Gross per 24 hour  Intake             1300 ml  Output                0 ml  Net             1300 ml    Filed Weights   07/25/16 0338  Weight: 67.1 kg (148 lb)    Examination:  General exam:  Adult female.  No acute distress.  HEENT:  NCAT, MMM Respiratory system: Clear to auscultation bilaterally Cardiovascular system: Regular rate and rhythm, normal S1/S2. No murmurs, rubs, gallops or clicks.  Warm extremities Gastrointestinal system:  Active bowel sounds but higher pitched, soft, mildly distended, nontender. MSK:  Normal tone and bulk, no lower extremity edema Neuro:  Grossly intact    Data Reviewed: I have personally reviewed following labs and imaging studies  CBC:  Recent Labs Lab 07/25/16 0337 07/25/16 0348 07/26/16 0526  WBC 11.6*  --  5.8  NEUTROABS 10.2*  --   --   HGB 14.7 15.6* 11.7*  HCT 46.1* 46.0 36.7  MCV 93.3  --  94.6  PLT 406*  --  [id number]   Basic Metabolic Panel:  Recent Labs Lab 07/25/16 0337 07/25/16 0348 07/26/16 0526  NA 141 142 139  K 3.8 3.7 3.7  CL 106 104 111  CO2 28  --  23  GLUCOSE 117* 116* 108*  BUN 15 17 7   CREATININE 0.88 0.80 0.63  CALCIUM 9.3  --  7.7*   GFR: Estimated Creatinine Clearance: 59.1 mL/min (  by C-G formula based on SCr of 0.8 mg/dL). Liver Function Tests:  Recent Labs Lab 07/25/16 0337  AST 26  ALT 20  ALKPHOS 56  BILITOT 0.5  PROT 6.9  ALBUMIN 3.8    Recent Labs Lab 07/25/16 0337  LIPASE 26   No results for input(s): AMMONIA in the last 168 hours. Coagulation Profile:  Recent Labs Lab 07/25/16 0634  INR 1.00   Cardiac Enzymes: No results for input(s): CKTOTAL, CKMB, CKMBINDEX, TROPONINI in the last 168 hours. BNP (last 3 results) No results for input(s): PROBNP in the last 8760 hours. HbA1C: No results for input(s): HGBA1C in the last 72 hours. CBG: No results for input(s): GLUCAP in the last 168 hours. Lipid Profile: No results for input(s): CHOL, HDL, LDLCALC, TRIG, CHOLHDL, LDLDIRECT in the last 72 hours. Thyroid Function Tests:  Recent Labs  07/25/16 0634  TSH 0.621    Anemia Panel: No results for input(s): VITAMINB12, FOLATE, FERRITIN, TIBC, IRON, RETICCTPCT in the last 72 hours. Urine analysis:    Component Value Date/Time   COLORURINE YELLOW 07/25/2016 0632   APPEARANCEUR CLEAR 07/25/2016 0632   LABSPEC 1.044 (H) 07/25/2016 0632   PHURINE 8.5 (H) 07/25/2016 0632   GLUCOSEU NEGATIVE 07/25/2016 0632   HGBUR NEGATIVE 07/25/2016 0632   BILIRUBINUR NEGATIVE 07/25/2016 0632   KETONESUR NEGATIVE 07/25/2016 0632   PROTEINUR NEGATIVE 07/25/2016 0632   NITRITE NEGATIVE 07/25/2016 0632   LEUKOCYTESUR SMALL (A) 07/25/2016 0632   Sepsis Labs: @LABRCNTIP (procalcitonin:4,lacticidven:4)  )No results found for this or any previous visit (from the past 240 hour(s)).    Radiology Studies: Ct Abdomen Pelvis W Contrast  Result Date: 07/25/2016 CLINICAL DATA:  Sudden onset of mid abdominal pain radiating to the back. EXAM: CT ABDOMEN AND PELVIS WITH CONTRAST TECHNIQUE: Multidetector CT imaging of the abdomen and pelvis was performed using the standard protocol following bolus administration of intravenous contrast. CONTRAST:  100 cc Isovue-300 IV COMPARISON:  CT 08/28/2009 FINDINGS: Lower chest: Dependent ground-glass opacities in the lung bases consistent with atelectasis. No pleural fluid. Liver: No focal lesion. Trace subdiaphragmatic perihepatic fluid is unchanged from prior exam. Hepatobiliary: Gallbladder physiologically distended, no calcified stone. No biliary dilatation. Pancreas: No ductal dilatation or inflammation. Spleen: Normal. Adrenal glands: No nodule. Kidneys: Symmetric renal enhancement and excretion. No hydronephrosis. No perinephric edema. Stomach/Bowel: Small hiatal hernia. Proximal small bowel loops are nondilated. More distal pelvic small bowel loops are dilated and fluid-filled with fecalization of small bowel contents and abrupt transition point in the left mid abdomen best appreciated on coronal reformats image 28 series 4. There is a  questionable enhancing focus at the site of transition. There is mesenteric edema small volume of free fluid. No pneumatosis or free air. Majority of the colon is decompressed, small volume of colonic stool in the ascending colon. Appendix tentatively identified and normal. Vascular/Lymphatic: No retroperitoneal adenopathy. Abdominal aorta is normal in caliber. Mild for age atherosclerosis without aneurysm. Reproductive: The uterus appears normal. No adnexal mass. The ovaries are not well-defined. Prominent left adnexal vascularity and dilated left ovarian veins, similar to prior exam. Bladder: Physiologically distended without wall thickening. Other: No intra-abdominal abscess or free air. Free fluid in the lower abdomen and pelvis. Tiny fat containing umbilical hernia. Musculoskeletal: Compression fracture of T12, new from CT 2010 but appears chronic. There is degenerative change in the lumbar spine with primarily facet arthropathy. There are no acute or suspicious osseous abnormalities. IMPRESSION: 1. Small bowel obstruction with transition point in the left mid abdomen. Questionable  enhancing focus at the site of obstruction which may be mesenteric vessels, however possibility of small bowel mass or AVM is raised. 2. Similar findings of prominent left adnexal vascularity and dilatation of the ovarian veins, suggesting pelvic congestion syndrome. Electronically Signed   By: Jeb Levering M.D.   On: 07/25/2016 05:13   Dg Abd Portable 1v  Result Date: 07/26/2016 CLINICAL DATA:  Nausea, vomiting. EXAM: PORTABLE ABDOMEN - 1 VIEW COMPARISON:  Radiographs of August 29, 2009. FINDINGS: The bowel gas pattern is normal. Small phlebolith is noted in right side of pelvis. IMPRESSION: No evidence of bowel obstruction or ileus. Electronically Signed   By: Marijo Conception, M.D.   On: 07/26/2016 07:37     Scheduled Meds: . levothyroxine  25 mcg Intravenous Daily   Continuous Infusions: . dextrose 5 % and 0.45 %  NaCl with KCl 20 mEq/L 100 mL/hr at 07/26/16 1205     LOS: 1 day    Time spent: 30 min    Janece Canterbury, MD [facility] Pager [phone]  If 7PM-7AM, please contact night-coverage [email or web address] Password [facility] 07/26/2016, 6:47 PM

## 2016-07-26 NOTE — Progress Notes (Signed)
I d/w the pt the surgery required, dx lap, lap SBR, possibly open surgery. I d/w her the risks and benefits of the procedure to include but not limited to: infection, bleeding, damage to surrounding structure, possibly leak, possible need for further surgery.  The patient voiced understanding and wishes to proceed.

## 2016-07-26 NOTE — Progress Notes (Signed)
Subjective: Pt doing well. NAE  Objective: Vital signs in last 24 hours: Temp:  [98.2 F (36.8 C)-98.3 F (36.8 C)] 98.2 F (36.8 C) (08/28 0520) Pulse Rate:  [55-63] 55 (08/28 0520) Resp:  [18-20] 18 (08/28 0520) BP: (120-124)/(62-63) 124/62 (08/28 0520) SpO2:  [94 %-100 %] 94 % (08/28 0520) Last BM Date: 07/24/16  Intake/Output from previous day: 08/27 0701 - 08/28 0700 In: 1281.7 [I.V.:1281.7] Out: -  Intake/Output this shift: No intake/output data recorded.  General appearance: alert and cooperative GI: soft, non-tender; bowel sounds normal; no masses,  no organomegaly  Lab Results:   Recent Labs  07/25/16 0337 07/25/16 0348 07/26/16 0526  WBC 11.6*  --  5.8  HGB 14.7 15.6* 11.7*  HCT 46.1* 46.0 36.7  PLT 406*  --  301   BMET  Recent Labs  07/25/16 0337 07/25/16 0348 07/26/16 0526  NA 141 142 139  K 3.8 3.7 3.7  CL 106 104 111  CO2 28  --  23  GLUCOSE 117* 116* 108*  BUN 15 17 7   CREATININE 0.88 0.80 0.63  CALCIUM 9.3  --  7.7*   PT/INR  Recent Labs  07/25/16 0634  LABPROT 13.2  INR 1.00   ABG No results for input(s): PHART, HCO3 in the last 72 hours.  Invalid input(s): PCO2, PO2  Studies/Results: Ct Abdomen Pelvis W Contrast  Result Date: 07/25/2016 CLINICAL DATA:  Sudden onset of mid abdominal pain radiating to the back. EXAM: CT ABDOMEN AND PELVIS WITH CONTRAST TECHNIQUE: Multidetector CT imaging of the abdomen and pelvis was performed using the standard protocol following bolus administration of intravenous contrast. CONTRAST:  100 cc Isovue-300 IV COMPARISON:  CT 08/28/2009 FINDINGS: Lower chest: Dependent ground-glass opacities in the lung bases consistent with atelectasis. No pleural fluid. Liver: No focal lesion. Trace subdiaphragmatic perihepatic fluid is unchanged from prior exam. Hepatobiliary: Gallbladder physiologically distended, no calcified stone. No biliary dilatation. Pancreas: No ductal dilatation or inflammation. Spleen:  Normal. Adrenal glands: No nodule. Kidneys: Symmetric renal enhancement and excretion. No hydronephrosis. No perinephric edema. Stomach/Bowel: Small hiatal hernia. Proximal small bowel loops are nondilated. More distal pelvic small bowel loops are dilated and fluid-filled with fecalization of small bowel contents and abrupt transition point in the left mid abdomen best appreciated on coronal reformats image 28 series 4. There is a questionable enhancing focus at the site of transition. There is mesenteric edema small volume of free fluid. No pneumatosis or free air. Majority of the colon is decompressed, small volume of colonic stool in the ascending colon. Appendix tentatively identified and normal. Vascular/Lymphatic: No retroperitoneal adenopathy. Abdominal aorta is normal in caliber. Mild for age atherosclerosis without aneurysm. Reproductive: The uterus appears normal. No adnexal mass. The ovaries are not well-defined. Prominent left adnexal vascularity and dilated left ovarian veins, similar to prior exam. Bladder: Physiologically distended without wall thickening. Other: No intra-abdominal abscess or free air. Free fluid in the lower abdomen and pelvis. Tiny fat containing umbilical hernia. Musculoskeletal: Compression fracture of T12, new from CT 2010 but appears chronic. There is degenerative change in the lumbar spine with primarily facet arthropathy. There are no acute or suspicious osseous abnormalities. IMPRESSION: 1. Small bowel obstruction with transition point in the left mid abdomen. Questionable enhancing focus at the site of obstruction which may be mesenteric vessels, however possibility of small bowel mass or AVM is raised. 2. Similar findings of prominent left adnexal vascularity and dilatation of the ovarian veins, suggesting pelvic congestion syndrome. Electronically Signed  By: Jeb Levering M.D.   On: 07/25/2016 05:13   Dg Abd Portable 1v  Result Date: 07/26/2016 CLINICAL DATA:   Nausea, vomiting. EXAM: PORTABLE ABDOMEN - 1 VIEW COMPARISON:  Radiographs of August 29, 2009. FINDINGS: The bowel gas pattern is normal. Small phlebolith is noted in right side of pelvis. IMPRESSION: No evidence of bowel obstruction or ileus. Electronically Signed   By: Marijo Conception, M.D.   On: 07/26/2016 07:37    Anti-infectives: Anti-infectives    None      Assessment/Plan: 65 y/o F with possible SB mass/stricture causeing recurrent epidsodes of n/v/abd pain. -She is doing well and has not had much n/v recently.  She states she is unsure if she wants to have surgery at this time.  She wants to talk with her family.  I d/w her that she would likely require a SB resection for definitive pathology. Will f/u with her  LOS: 1 day    Rosario Jacks., [facility] 07/26/2016

## 2016-07-27 ENCOUNTER — Inpatient Hospital Stay (HOSPITAL_COMMUNITY): Payer: Medicare Other | Admitting: Certified Registered Nurse Anesthetist

## 2016-07-27 ENCOUNTER — Encounter (HOSPITAL_COMMUNITY)
Admission: EM | Disposition: A | Discharge status: Home Health | Payer: Self-pay | Source: Home / Self Care | Attending: Family Medicine

## 2016-07-27 HISTORY — PX: BOWEL RESECTION: SHX1257

## 2016-07-27 HISTORY — PX: LAPAROTOMY: SHX154

## 2016-07-27 HISTORY — PX: LAPAROSCOPY: SHX197

## 2016-07-27 LAB — SURGICAL PCR SCREEN
MRSA, PCR: NEGATIVE
STAPHYLOCOCCUS AUREUS: POSITIVE — AB

## 2016-07-27 LAB — GLUCOSE, CAPILLARY: Glucose-Capillary: 98 mg/dL (ref 65–99)

## 2016-07-27 SURGERY — LAPAROSCOPY, DIAGNOSTIC
Anesthesia: General | Site: Abdomen

## 2016-07-27 MED ORDER — HYDROMORPHONE HCL 1 MG/ML IJ SOLN
INTRAMUSCULAR | Status: AC
  Filled 2016-07-27: qty 1

## 2016-07-27 MED ORDER — MUPIROCIN 2 % EX OINT
1.0000 "application " | TOPICAL_OINTMENT | Freq: Two times a day (BID) | CUTANEOUS | Status: AC
  Administered 2016-07-27 – 2016-07-31 (×10): 1 via NASAL
  Filled 2016-07-27 (×3): qty 22

## 2016-07-27 MED ORDER — SUGAMMADEX SODIUM 200 MG/2ML IV SOLN
INTRAVENOUS | Status: DC | PRN
  Administered 2016-07-27: 200 mg via INTRAVENOUS

## 2016-07-27 MED ORDER — HYDROMORPHONE HCL 1 MG/ML IJ SOLN
0.2500 mg | INTRAMUSCULAR | Status: DC | PRN
  Administered 2016-07-27 (×4): 0.5 mg via INTRAVENOUS

## 2016-07-27 MED ORDER — BUPIVACAINE HCL (PF) 0.25 % IJ SOLN
INTRAMUSCULAR | Status: AC
  Filled 2016-07-27: qty 30

## 2016-07-27 MED ORDER — BUPIVACAINE HCL 0.25 % IJ SOLN
INTRAMUSCULAR | Status: DC | PRN
  Administered 2016-07-27: 4 mL

## 2016-07-27 MED ORDER — MIDAZOLAM HCL 2 MG/2ML IJ SOLN
INTRAMUSCULAR | Status: AC
  Filled 2016-07-27: qty 2

## 2016-07-27 MED ORDER — OXYCODONE-ACETAMINOPHEN 5-325 MG PO TABS: 1.0000 | ORAL_TABLET | ORAL | Status: DC | PRN

## 2016-07-27 MED ORDER — ENOXAPARIN SODIUM 40 MG/0.4ML ~~LOC~~ SOLN
40.0000 mg | SUBCUTANEOUS | Status: DC
  Administered 2016-07-27 – 2016-08-01 (×7): 40 mg via SUBCUTANEOUS
  Filled 2016-07-27 (×6): qty 0.4

## 2016-07-27 MED ORDER — ACETAMINOPHEN 10 MG/ML IV SOLN
INTRAVENOUS | Status: AC
  Administered 2016-07-27: 1000 mg
  Filled 2016-07-27: qty 100

## 2016-07-27 MED ORDER — CHLORHEXIDINE GLUCONATE CLOTH 2 % EX PADS
6.0000 | MEDICATED_PAD | Freq: Every day | CUTANEOUS | Status: AC
  Administered 2016-07-30 – 2016-07-31 (×2): 6 via TOPICAL

## 2016-07-27 MED ORDER — ROCURONIUM BROMIDE 10 MG/ML (PF) SYRINGE
PREFILLED_SYRINGE | INTRAVENOUS | Status: AC
  Filled 2016-07-27: qty 10

## 2016-07-27 MED ORDER — PROPOFOL 10 MG/ML IV BOLUS
INTRAVENOUS | Status: AC
  Filled 2016-07-27: qty 40

## 2016-07-27 MED ORDER — LIDOCAINE 2% (20 MG/ML) 5 ML SYRINGE
INTRAMUSCULAR | Status: AC
  Filled 2016-07-27: qty 5

## 2016-07-27 MED ORDER — PHENYLEPHRINE 40 MCG/ML (10ML) SYRINGE FOR IV PUSH (FOR BLOOD PRESSURE SUPPORT)
PREFILLED_SYRINGE | INTRAVENOUS | Status: AC
  Filled 2016-07-27: qty 10

## 2016-07-27 MED ORDER — DEXAMETHASONE SODIUM PHOSPHATE 10 MG/ML IJ SOLN
INTRAMUSCULAR | Status: AC
  Filled 2016-07-27: qty 1

## 2016-07-27 MED ORDER — HYDROMORPHONE HCL 1 MG/ML IJ SOLN
1.0000 mg | INTRAMUSCULAR | Status: DC | PRN
  Administered 2016-07-29 (×2): 1 mg via INTRAVENOUS
  Administered 2016-07-29: 2 mg via INTRAVENOUS
  Administered 2016-07-30: 1 mg via INTRAVENOUS
  Filled 2016-07-27: qty 1
  Filled 2016-07-27: qty 2
  Filled 2016-07-27 (×2): qty 1

## 2016-07-27 MED ORDER — ONDANSETRON HCL 4 MG/2ML IJ SOLN: 4.0000 mg | Freq: Once | INTRAMUSCULAR | Status: DC | PRN

## 2016-07-27 MED ORDER — ACETAMINOPHEN 10 MG/ML IV SOLN: 1000.0000 mg | Freq: Four times a day (QID) | INTRAVENOUS | Status: DC

## 2016-07-27 MED ORDER — ONDANSETRON HCL 4 MG/2ML IJ SOLN
INTRAMUSCULAR | Status: AC
  Filled 2016-07-27: qty 2

## 2016-07-27 MED ORDER — DEXTROSE 5 % IV SOLN
2.0000 g | Freq: Once | INTRAVENOUS | Status: AC
  Administered 2016-07-27: 2 g via INTRAVENOUS
  Filled 2016-07-27: qty 2

## 2016-07-27 MED ORDER — SUGAMMADEX SODIUM 200 MG/2ML IV SOLN
INTRAVENOUS | Status: AC
  Filled 2016-07-27: qty 2

## 2016-07-27 MED ORDER — ONDANSETRON HCL 4 MG/2ML IJ SOLN
INTRAMUSCULAR | Status: DC | PRN
  Administered 2016-07-27: 4 mg via INTRAVENOUS

## 2016-07-27 MED ORDER — LIDOCAINE 2% (20 MG/ML) 5 ML SYRINGE
INTRAMUSCULAR | Status: DC | PRN
  Administered 2016-07-27: 80 mg via INTRAVENOUS

## 2016-07-27 MED ORDER — EPHEDRINE 5 MG/ML INJ
INTRAVENOUS | Status: AC
  Filled 2016-07-27: qty 10

## 2016-07-27 MED ORDER — 0.9 % SODIUM CHLORIDE (POUR BTL) OPTIME
TOPICAL | Status: DC | PRN
  Administered 2016-07-27: 1000 mL

## 2016-07-27 MED ORDER — HYDROMORPHONE HCL 1 MG/ML IJ SOLN: 0.5000 mg | Freq: Once | INTRAMUSCULAR | Status: DC

## 2016-07-27 MED ORDER — ROCURONIUM BROMIDE 10 MG/ML (PF) SYRINGE
PREFILLED_SYRINGE | INTRAVENOUS | Status: DC | PRN
  Administered 2016-07-27: 30 mg via INTRAVENOUS
  Administered 2016-07-27: 20 mg via INTRAVENOUS

## 2016-07-27 MED ORDER — DEXAMETHASONE SODIUM PHOSPHATE 10 MG/ML IJ SOLN
INTRAMUSCULAR | Status: DC | PRN
  Administered 2016-07-27: 10 mg via INTRAVENOUS

## 2016-07-27 MED ORDER — DEXTROSE-NACL 5-0.9 % IV SOLN
INTRAVENOUS | Status: DC
  Administered 2016-07-29: 10:00:00 via INTRAVENOUS
  Administered 2016-07-29 – 2016-07-30 (×2): 1000 mL via INTRAVENOUS
  Administered 2016-07-30: 19:00:00 via INTRAVENOUS
  Administered 2016-07-31: 1000 mL via INTRAVENOUS
  Filled 2016-07-27: qty 1000

## 2016-07-27 MED ORDER — FENTANYL CITRATE (PF) 100 MCG/2ML IJ SOLN
INTRAMUSCULAR | Status: DC | PRN
  Administered 2016-07-27 (×4): 50 ug via INTRAVENOUS

## 2016-07-27 MED ORDER — PROPOFOL 10 MG/ML IV BOLUS
INTRAVENOUS | Status: DC | PRN
  Administered 2016-07-27: 110 mg via INTRAVENOUS

## 2016-07-27 MED ORDER — SUCCINYLCHOLINE CHLORIDE 200 MG/10ML IV SOSY
PREFILLED_SYRINGE | INTRAVENOUS | Status: AC
  Filled 2016-07-27: qty 10

## 2016-07-27 MED ORDER — MIDAZOLAM HCL 5 MG/5ML IJ SOLN
INTRAMUSCULAR | Status: DC | PRN
  Administered 2016-07-27: 2 mg via INTRAVENOUS

## 2016-07-27 MED ORDER — DEXTROSE 5 % IV SOLN
2.0000 g | Freq: Two times a day (BID) | INTRAVENOUS | Status: DC
  Administered 2016-07-27: 2 g via INTRAVENOUS
  Filled 2016-07-27: qty 2

## 2016-07-27 MED ORDER — ONDANSETRON HCL 4 MG/2ML IJ SOLN: 4.0000 mg | Freq: Four times a day (QID) | INTRAMUSCULAR | Status: DC | PRN

## 2016-07-27 MED ORDER — ONDANSETRON 4 MG PO TBDP: 4.0000 mg | ORAL_TABLET | Freq: Four times a day (QID) | ORAL | Status: DC | PRN

## 2016-07-27 MED ORDER — ACETAMINOPHEN 10 MG/ML IV SOLN
1000.0000 mg | Freq: Four times a day (QID) | INTRAVENOUS | Status: AC
  Administered 2016-07-27 – 2016-07-28 (×4): 1000 mg via INTRAVENOUS
  Filled 2016-07-27 (×5): qty 100

## 2016-07-27 MED ORDER — SUCCINYLCHOLINE CHLORIDE 200 MG/10ML IV SOSY
PREFILLED_SYRINGE | INTRAVENOUS | Status: DC | PRN
  Administered 2016-07-27: 120 mg via INTRAVENOUS

## 2016-07-27 MED ORDER — PHENYLEPHRINE HCL 10 MG/ML IJ SOLN
INTRAVENOUS | Status: DC | PRN
  Administered 2016-07-27: 20 ug/min via INTRAVENOUS
  Administered 2016-07-27: 10 ug/min via INTRAVENOUS

## 2016-07-27 MED ORDER — FENTANYL CITRATE (PF) 100 MCG/2ML IJ SOLN
INTRAMUSCULAR | Status: AC
  Filled 2016-07-27: qty 4

## 2016-07-27 MED ORDER — MEPERIDINE HCL 25 MG/ML IJ SOLN: 6.2500 mg | INTRAMUSCULAR | Status: DC | PRN

## 2016-07-27 MED ORDER — LACTATED RINGERS IV SOLN
INTRAVENOUS | Status: DC | PRN
  Administered 2016-07-27 (×2): via INTRAVENOUS

## 2016-07-27 SURGICAL SUPPLY — 58 items
BENZOIN TINCTURE PRP APPL 2/3 (GAUZE/BANDAGES/DRESSINGS) ×3 IMPLANT
BLADE SURG ROTATE 9660 (MISCELLANEOUS) IMPLANT
CANISTER SUCTION 2500CC (MISCELLANEOUS) ×3 IMPLANT
CHLORAPREP W/TINT 26ML (MISCELLANEOUS) ×3 IMPLANT
CLOSURE STERI-STRIP 1/2X4 (GAUZE/BANDAGES/DRESSINGS) ×1
CLSR STERI-STRIP ANTIMIC 1/2X4 (GAUZE/BANDAGES/DRESSINGS) ×2 IMPLANT
COVER SURGICAL LIGHT HANDLE (MISCELLANEOUS) ×3 IMPLANT
DEVICE TROCAR PUNCTURE CLOSURE (ENDOMECHANICALS) ×3 IMPLANT
DRAPE LAPAROSCOPIC ABDOMINAL (DRAPES) ×3 IMPLANT
DRAPE WARM FLUID 44X44 (DRAPE) ×3 IMPLANT
DRSG OPSITE POSTOP 4X10 (GAUZE/BANDAGES/DRESSINGS) IMPLANT
DRSG OPSITE POSTOP 4X8 (GAUZE/BANDAGES/DRESSINGS) IMPLANT
ELECT BLADE 6.5 EXT (BLADE) IMPLANT
ELECT CAUTERY BLADE 6.4 (BLADE) ×3 IMPLANT
ELECT REM PT RETURN 9FT ADLT (ELECTROSURGICAL) ×3
ELECTRODE REM PT RTRN 9FT ADLT (ELECTROSURGICAL) ×1 IMPLANT
GAUZE SPONGE 2X2 8PLY NS (GAUZE/BANDAGES/DRESSINGS) ×3 IMPLANT
GLOVE BIO SURGEON STRL SZ7.5 (GLOVE) ×3 IMPLANT
GLOVE BIOGEL PI IND STRL 8 (GLOVE) ×1 IMPLANT
GLOVE BIOGEL PI INDICATOR 8 (GLOVE) ×2
GOWN STRL REUS W/ TWL LRG LVL3 (GOWN DISPOSABLE) ×2 IMPLANT
GOWN STRL REUS W/ TWL XL LVL3 (GOWN DISPOSABLE) ×1 IMPLANT
GOWN STRL REUS W/TWL LRG LVL3 (GOWN DISPOSABLE) ×4
GOWN STRL REUS W/TWL XL LVL3 (GOWN DISPOSABLE) ×2
KIT BASIN OR (CUSTOM PROCEDURE TRAY) ×3 IMPLANT
KIT ROOM TURNOVER OR (KITS) ×3 IMPLANT
LIGASURE IMPACT 36 18CM CVD LR (INSTRUMENTS) IMPLANT
LIQUID BAND (GAUZE/BANDAGES/DRESSINGS) ×3 IMPLANT
NS IRRIG 1000ML POUR BTL (IV SOLUTION) ×6 IMPLANT
PACK GENERAL/GYN (CUSTOM PROCEDURE TRAY) ×3 IMPLANT
PAD ARMBOARD 7.5X6 YLW CONV (MISCELLANEOUS) ×6 IMPLANT
RELOAD PROXIMATE 75MM BLUE (ENDOMECHANICALS) ×3 IMPLANT
SCISSORS LAP 5X35 DISP (ENDOMECHANICALS) IMPLANT
SET IRRIG TUBING LAPAROSCOPIC (IRRIGATION / IRRIGATOR) IMPLANT
SLEEVE ENDOPATH XCEL 5M (ENDOMECHANICALS) ×3 IMPLANT
SPECIMEN JAR LARGE (MISCELLANEOUS) IMPLANT
SPONGE LAP 18X18 X RAY DECT (DISPOSABLE) IMPLANT
STAPLER GUN LINEAR PROX 60 (STAPLE) ×3 IMPLANT
STAPLER PROXIMATE 75MM BLUE (STAPLE) ×6 IMPLANT
STAPLER VISISTAT 35W (STAPLE) ×3 IMPLANT
SUCTION POOLE TIP (SUCTIONS) ×3 IMPLANT
SUT MNCRL AB 4-0 PS2 18 (SUTURE) ×6 IMPLANT
SUT PDS AB 1 TP1 96 (SUTURE) ×6 IMPLANT
SUT PDS AB 2-0 CT1 27 (SUTURE) ×6 IMPLANT
SUT SILK 2 0 SH CR/8 (SUTURE) ×3 IMPLANT
SUT SILK 2 0 TIES 10X30 (SUTURE) ×3 IMPLANT
SUT SILK 3 0 SH CR/8 (SUTURE) ×3 IMPLANT
SUT SILK 3 0 TIES 10X30 (SUTURE) ×3 IMPLANT
TOWEL OR 17X24 6PK STRL BLUE (TOWEL DISPOSABLE) ×3 IMPLANT
TOWEL OR 17X26 10 PK STRL BLUE (TOWEL DISPOSABLE) ×3 IMPLANT
TRAY FOLEY CATH 16FRSI W/METER (SET/KITS/TRAYS/PACK) ×3 IMPLANT
TRAY LAPAROSCOPIC MC (CUSTOM PROCEDURE TRAY) ×3 IMPLANT
TROCAR XCEL 12X100 BLDLESS (ENDOMECHANICALS) IMPLANT
TROCAR XCEL BLUNT TIP 100MML (ENDOMECHANICALS) IMPLANT
TROCAR XCEL NON-BLD 11X100MML (ENDOMECHANICALS) IMPLANT
TROCAR XCEL NON-BLD 5MMX100MML (ENDOMECHANICALS) ×3 IMPLANT
TUBING INSUFFLATION (TUBING) ×3 IMPLANT
YANKAUER SUCT BULB TIP NO VENT (SUCTIONS) IMPLANT

## 2016-07-27 NOTE — Op Note (Signed)
07/27/2016  8:53 AM  PATIENT:  Anne Stark  65 y.o. female  PRE-OPERATIVE DIAGNOSIS:  Small Bowel Mass  POST-OPERATIVE DIAGNOSIS:  Small Bowel Mass  PROCEDURE:  Procedure(s): LAPAROSCOPY DIAGNOSTIC (N/A) Small bowel resection with anastomosis (N/A)  SURGEON:  Surgeon(s) and Role:    * Ralene Ok, MD - Primary  ANESTHESIA:   local and general  EBL:  Total I/O In: -  Out: 475 [Urine:450; Blood:10]  BLOOD ADMINISTERED:none  DRAINS: none   LOCAL MEDICATIONS USED:  BUPIVICAINE   SPECIMEN:  Source of Specimen:  Mid ileum  DISPOSITION OF SPECIMEN:  PATHOLOGY  COUNTS:  YES  TOURNIQUET:  * No tourniquets in log *  DICTATION: .Dragon Dictation Indications for procedure: Patient is a 65 year old female with a chronic history of abdominal pain, nausea, vomiting. Patient was admitted to the ER underwent CT scan which revealed a stricture/mass in the small bowel. Secondary to the chronicity patient decided for elective surgical resection.  Details of procedure: After the patient was consented she was taken back to the OR and placed in supine position with bilateral SCDs were placed. Patient with general endotracheal anesthesia. Foley catheter was placed. Timeout was called, all facts verified.  A Veress needle incision was made to insufflate the abdomen and 15 mmHg in the right lower quadrant. Subsequent to this femoral intertrochanteric Placed intra-abdominally. There was no injury to any abdominal organs.  Two 5 mm trochars were then placed in the right upper quadrant under direct visualization. At this time the omentum was retracted cephalad. The ligament of Treitz was identified. The small bowel was then run from the ligament Treitz distally to the terminal ileum. The mid ileum. There was an area of scar tissue/stricture but could be easily identified externally. The rest of the small bowel appeared normal. There was also what appeared to be a accessory spleen and the omentum  near the left/sigmoid colon.  At this time the small bowel was grafts a small infraumbilical midline incision was made using a 15 blade. Cautery was used to maintain hemostasis and dissection was taken down to the midline fascia. This was incised. This was extended to approximately 3-4 cm. The small bowel was then brought out through the wound. A section of approximate 5 cm on either side of the mass was chosen for resection. The mesentery was incised with cautery. A 75 GIA stapler was used to transect each end of the bowel. Kelly clamps were then used to ligate the mesentery. This was sent to pathology.  At this time to place around the incision site. Corner of each staple line was then transected. A 75 GIA stapler was then used to create a common enterotomy. The common enterotomy was then closed using a second firing of the 75 GIA staplers. Staple lines were offset. The apex stitch using 3-0 silk was placed. At this time the anastomosis was then placed back into the abdominal cavity. A 2-0 PDS was used in a standard running fashion 1 to reapproximate the fascia here.   Insufflation was begun. The bowel appeared non-twisted in an appropriate fashion. The omentum was then brought over the anastomosis. Upon observing the rest of the abdominal cavity there was no lesions on the Liver. The Pelvis Appeared to Be without Any Obvious Masses. This Time the Insufflation Was Evacuated. All Trochars Removed. Skin Was Reapproximated and All Trocar Sites Using 4-0 Monocryl Subcuticular Fashion.   The Patient tolerated the Procedure Well  And Was Taken to the Recovery Stable Condition.  PLAN OF CARE: Admitted as inpatient  PATIENT DISPOSITION:  PACU - hemodynamically stable.   Delay start of Pharmacological VTE agent (>24hrs) due to surgical blood loss or risk of bleeding: no

## 2016-07-27 NOTE — Anesthesia Preprocedure Evaluation (Signed)
Anesthesia Evaluation  Patient identified by MRN, date of birth, ID band Patient awake    Reviewed: Allergy & Precautions, NPO status , Patient's Chart, lab work & pertinent test results  History of Anesthesia Complications Negative for: history of anesthetic complications  Airway Mallampati: I  TM Distance: >3 FB Neck ROM: Full    Dental  (+) Dental Advisory Given, Caps, Teeth Intact,    Pulmonary neg pulmonary ROS,    breath sounds clear to auscultation       Cardiovascular Exercise Tolerance: Good hypertension, Pt. on medications  Rhythm:Regular Rate:Normal     Neuro/Psych PSYCHIATRIC DISORDERS Anxiety negative neurological ROS     GI/Hepatic Neg liver ROS, C/o abd pain, n/v w/ questionable sbo   Endo/Other  Hypothyroidism   Renal/GU negative Renal ROS  negative genitourinary   Musculoskeletal negative musculoskeletal ROS (+)   Abdominal (+)  Abdomen: tender. Bowel sounds: normal.  Peds negative pediatric ROS (+)  Hematology negative hematology ROS (+)   Anesthesia Other Findings Denies N/V today  Reproductive/Obstetrics                            EKG 07/25/16: Sinus rhythm Left atrial enlargement Borderline right axis deviation Minimal ST depression, inferior leads  BP Readings from Last 3 Encounters:  07/27/16 139/71  11/23/15 131/74  06/09/12 111/57   Lab Results  Component Value Date   WBC 5.8 07/26/2016   HGB 11.7 (L) 07/26/2016   HCT 36.7 07/26/2016   MCV 94.6 07/26/2016   PLT 301 07/26/2016     Chemistry      Component Value Date/Time   NA 139 07/26/2016 0526   K 3.7 07/26/2016 0526   CL 111 07/26/2016 0526   CO2 23 07/26/2016 0526   BUN 7 07/26/2016 0526   CREATININE 0.63 07/26/2016 0526      Component Value Date/Time   CALCIUM 7.7 (L) 07/26/2016 0526   ALKPHOS 56 07/25/2016 0337   AST 26 07/25/2016 0337   ALT 20 07/25/2016 0337   BILITOT 0.5  07/25/2016 0337      Anesthesia Physical Anesthesia Plan  ASA: II  Anesthesia Plan: General   Post-op Pain Management:    Induction: Rapid sequence, Cricoid pressure planned and Intravenous  Airway Management Planned: Oral ETT  Additional Equipment:   Intra-op Plan:   Post-operative Plan: Extubation in OR  Informed Consent: I have reviewed the patients History and Physical, chart, labs and discussed the procedure including the risks, benefits and alternatives for the proposed anesthesia with the patient or authorized representative who has indicated his/her understanding and acceptance.   Dental advisory given  Plan Discussed with: Anesthesiologist and Surgeon  Anesthesia Plan Comments:         Anesthesia Quick Evaluation

## 2016-07-27 NOTE — Anesthesia Preprocedure Evaluation (Signed)
Anesthesia Evaluation  Patient identified by MRN, date of birth, ID band Patient awake    Reviewed: Allergy & Precautions, NPO status , Patient's Chart, lab work & pertinent test results  Airway Mallampati: I  TM Distance: >3 FB Neck ROM: Full    Dental   Pulmonary    Pulmonary exam normal        Cardiovascular hypertension, Pt. on medications Normal cardiovascular exam     Neuro/Psych    GI/Hepatic   Endo/Other    Renal/GU      Musculoskeletal   Abdominal   Peds  Hematology   Anesthesia Other Findings   Reproductive/Obstetrics                             Anesthesia Physical Anesthesia Plan  ASA: II  Anesthesia Plan: General   Post-op Pain Management:    Induction: Intravenous  Airway Management Planned: Oral ETT  Additional Equipment:   Intra-op Plan:   Post-operative Plan: Extubation in OR  Informed Consent: I have reviewed the patients History and Physical, chart, labs and discussed the procedure including the risks, benefits and alternatives for the proposed anesthesia with the patient or authorized representative who has indicated his/her understanding and acceptance.     Plan Discussed with: CRNA and Surgeon  Anesthesia Plan Comments:         Anesthesia Quick Evaluation  

## 2016-07-27 NOTE — Anesthesia Procedure Notes (Signed)
Procedure Name: Intubation Date/Time: 07/27/2016 7:41 AM Performed by: Mervyn Gay Pre-anesthesia Checklist: Patient identified, Patient being monitored, Timeout performed, Emergency Drugs available and Suction available Patient Re-evaluated:Patient Re-evaluated prior to inductionOxygen Delivery Method: Circle System Utilized Preoxygenation: Pre-oxygenation with 100% oxygen Intubation Type: IV induction, Cricoid Pressure applied and Rapid sequence Laryngoscope Size: 3 and Mac Grade View: Grade III Tube type: Oral Tube size: 7.5 mm Number of attempts: 1 Airway Equipment and Method: Stylet Placement Confirmation: ETT inserted through vocal cords under direct vision,  positive ETCO2 and breath sounds checked- equal and bilateral Secured at: 22 cm Tube secured with: Tape Dental Injury: Teeth and Oropharynx as per pre-operative assessment

## 2016-07-27 NOTE — Anesthesia Postprocedure Evaluation (Signed)
Anesthesia Post Note  Patient: Anne Stark  Procedure(s) Performed: Procedure(s) (LRB): LAPAROSCOPY DIAGNOSTIC (N/A) EXPLORATORY LAPAROTOMY (N/A) SMALL BOWEL RESECTION (N/A)  Patient location during evaluation: PACU Anesthesia Type: General Level of consciousness: awake and alert Pain management: pain level controlled Vital Signs Assessment: post-procedure vital signs reviewed and stable Respiratory status: spontaneous breathing, nonlabored ventilation, respiratory function stable and patient connected to nasal cannula oxygen Cardiovascular status: blood pressure returned to baseline and stable Postop Assessment: no signs of nausea or vomiting Anesthetic complications: no    Last Vitals:  Vitals:   07/27/16 1015 07/27/16 1030  BP: 131/68   Pulse: 61 68  Resp: 10 14  Temp:      Last Pain:  Vitals:   07/27/16 1030  TempSrc:   PainSc: [place] DAVID

## 2016-07-27 NOTE — Progress Notes (Signed)
PROGRESS NOTE  Anne Stark  [id number] DOB: 1951-03-22 DOA: 07/25/2016 PCP: Reginia Naas, MD  Brief Narrative:   Anne Stark is a 65 y.o. female with medical history significant of hypertension, hyperlipidemia, hypothyroidism, who presents with nausea, vomiting,  abdominal pain.  CT demonstrates SBO.  General surgery consulted.    Assessment & Plan:   Principal Problem:   SBO (small bowel obstruction) (HCC) Active Problems:   Hypothyroidism   HLD (hyperlipidemia)   Essential hypertension   Abdominal pain   Nausea & vomiting  SBO (small bowel obstruction) with nausea, vomiting, and loose stools:  CT abdomen/pelvis showed SBO due to a small bowel mass, stricture, or AVM.   Underwent open small bowel resection on 07/27/2016. -  IVF -  NG to LIS -  Appreciate general surgery assistance  Hypothyroidism: TSH 0.621 on 07/25/2016 -  IV levothyroxine  HLD: hold statin while NPO  Hypertension, blood pressure rising - increase and change parameter for IV hydralazine  DVT ppx: SCD Code Status: Full code Family Communication: Yes, patient alone Disposition Plan:  Anticipate discharge back to previous home environment   Consultants:   General surgery  Procedures:  none  Antimicrobials:   perioperative   Subjective: Having abdominal pain post-operatively.  RN bringing medication.  Denies nausea, SOB, and chest pain.      Objective: Vitals:   07/27/16 1045 07/27/16 1100 07/27/16 1117 07/27/16 1346  BP: 129/67  139/71 132/70  Pulse: 67 70 68 74  Resp: 10 11 18 17   Temp:   97.6 F (36.4 C) 98.1 F (36.7 C)  TempSrc:   Oral Oral  SpO2: 99% 98% 96% 95%  Weight:      Height:        Intake/Output Summary (Last 24 hours) at 07/27/16 1433 Last data filed at 07/27/16 1000  Gross per 24 hour  Intake             3885 ml  Output              575 ml  Net             3310 ml   Filed Weights   07/25/16 0338  Weight: 67.1 kg (148 lb)     Examination:  General exam:  Adult female.  No acute distress.  HEENT:  NCAT, MMM Respiratory system: Clear to auscultation bilaterally Cardiovascular system: Regular rate and rhythm, normal S1/S2. No murmurs, rubs, gallops or clicks.  Warm extremities Gastrointestinal system:  Absent BS.  Bandages c/d/i.  Somewhat rigid abdomen due to pain.   MSK:  Normal tone and bulk, no lower extremity edema Neuro:  Grossly intact    Data Reviewed: I have personally reviewed following labs and imaging studies  CBC:  Recent Labs Lab 07/25/16 0337 07/25/16 0348 07/26/16 0526  WBC 11.6*  --  5.8  NEUTROABS 10.2*  --   --   HGB 14.7 15.6* 11.7*  HCT 46.1* 46.0 36.7  MCV 93.3  --  94.6  PLT 406*  --  [id number]   Basic Metabolic Panel:  Recent Labs Lab 07/25/16 0337 07/25/16 0348 07/26/16 0526  NA 141 142 139  K 3.8 3.7 3.7  CL 106 104 111  CO2 28  --  23  GLUCOSE 117* 116* 108*  BUN 15 17 7   CREATININE 0.88 0.80 0.63  CALCIUM 9.3  --  7.7*   GFR: Estimated Creatinine Clearance: 59.1 mL/min (by C-G formula based on SCr of 0.8 mg/dL). Liver Function Tests:  Recent Labs Lab 07/25/16 0337  AST 26  ALT 20  ALKPHOS 56  BILITOT 0.5  PROT 6.9  ALBUMIN 3.8    Recent Labs Lab 07/25/16 0337  LIPASE 26   No results for input(s): AMMONIA in the last 168 hours. Coagulation Profile:  Recent Labs Lab 07/25/16 0634  INR 1.00   Cardiac Enzymes: No results for input(s): CKTOTAL, CKMB, CKMBINDEX, TROPONINI in the last 168 hours. BNP (last 3 results) No results for input(s): PROBNP in the last 8760 hours. HbA1C: No results for input(s): HGBA1C in the last 72 hours. CBG:  Recent Labs Lab 07/27/16 0542  GLUCAP 98   Lipid Profile: No results for input(s): CHOL, HDL, LDLCALC, TRIG, CHOLHDL, LDLDIRECT in the last 72 hours. Thyroid Function Tests:  Recent Labs  07/25/16 0634  TSH 0.621   Anemia Panel: No results for input(s): VITAMINB12, FOLATE, FERRITIN, TIBC, IRON,  RETICCTPCT in the last 72 hours. Urine analysis:    Component Value Date/Time   COLORURINE YELLOW 07/25/2016 0632   APPEARANCEUR CLEAR 07/25/2016 0632   LABSPEC 1.044 (H) 07/25/2016 0632   PHURINE 8.5 (H) 07/25/2016 0632   GLUCOSEU NEGATIVE 07/25/2016 0632   HGBUR NEGATIVE 07/25/2016 0632   BILIRUBINUR NEGATIVE 07/25/2016 0632   KETONESUR NEGATIVE 07/25/2016 0632   PROTEINUR NEGATIVE 07/25/2016 0632   NITRITE NEGATIVE 07/25/2016 0632   LEUKOCYTESUR SMALL (A) 07/25/2016 0632   Sepsis Labs: @LABRCNTIP (procalcitonin:4,lacticidven:4)  ) Recent Results (from the past 240 hour(s))  Surgical pcr screen     Status: Abnormal   Collection Time: 07/26/16 11:59 PM  Result Value Ref Range Status   MRSA, PCR NEGATIVE NEGATIVE Final   Staphylococcus aureus POSITIVE (A) NEGATIVE Final    Comment:        The Xpert SA Assay (FDA approved for NASAL specimens in patients over 55 years of age), is one component of a comprehensive surveillance program.  Test performance has been validated by [facility] for patients greater than or equal to 32 year old. It is not intended to diagnose infection nor to guide or monitor treatment.       Radiology Studies: Dg Abd Portable 1v  Result Date: 07/26/2016 CLINICAL DATA:  Nausea, vomiting. EXAM: PORTABLE ABDOMEN - 1 VIEW COMPARISON:  Radiographs of August 29, 2009. FINDINGS: The bowel gas pattern is normal. Small phlebolith is noted in right side of pelvis. IMPRESSION: No evidence of bowel obstruction or ileus. Electronically Signed   By: Marijo Conception, M.D.   On: 07/26/2016 07:37     Scheduled Meds: . acetaminophen  1,000 mg Intravenous Q6H  . cefoTEtan (CEFOTAN) IV  2 g Intravenous Once  . Chlorhexidine Gluconate Cloth  6 each Topical Daily  . enoxaparin (LOVENOX) injection  40 mg Subcutaneous Q24H  . HYDROmorphone      . HYDROmorphone      . levothyroxine  25 mcg Intravenous Daily  . mupirocin ointment  1 application Nasal BID    Continuous Infusions: . dextrose 5 % and 0.45 % NaCl with KCl 20 mEq/L 100 mL/hr at 07/26/16 2210  . dextrose 5 % and 0.9% NaCl       LOS: 2 days    Time spent: 30 min    Janece Canterbury, MD [facility] Pager [phone]  If 7PM-7AM, please contact night-coverage [email or web address] Password [facility] 07/27/2016, 2:33 PM

## 2016-07-27 NOTE — Transfer of Care (Signed)
Immediate Anesthesia Transfer of Care Note  Patient: Anne Stark  Procedure(s) Performed: Procedure(s): LAPAROSCOPY DIAGNOSTIC (N/A) EXPLORATORY LAPAROTOMY (N/A) SMALL BOWEL RESECTION (N/A)  Patient Location: PACU  Anesthesia Type:General  Level of Consciousness: awake, alert , oriented and patient cooperative  Airway & Oxygen Therapy: Patient Spontanous Breathing and Patient connected to nasal cannula oxygen  Post-op Assessment: Report given to RN, Post -op Vital signs reviewed and stable and Patient moving all extremities X 4  Post vital signs: Reviewed and stable  Last Vitals:  Vitals:   07/27/16 0420 07/27/16 0920  BP: 139/71   Pulse: (!) 58   Resp: 18   Temp: 37.4 C (P) 36.5 C    Last Pain:  Vitals:   07/27/16 0420  TempSrc: Oral  PainSc:          Complications: No apparent anesthesia complications

## 2016-07-28 ENCOUNTER — Encounter (HOSPITAL_COMMUNITY): Payer: Self-pay | Admitting: General Surgery

## 2016-07-28 DIAGNOSIS — R112 Nausea with vomiting, unspecified: Secondary | ICD-10-CM

## 2016-07-28 LAB — GLUCOSE, CAPILLARY: Glucose-Capillary: 102 mg/dL — ABNORMAL HIGH (ref 65–99)

## 2016-07-28 NOTE — Care Management Important Message (Signed)
Important Message  Patient Details  Name: Anne Stark MRN: [id number] Date of Birth: 1951/04/21   Medicare Important Message Given:  Yes    [name] 07/28/2016, 1:25 PM

## 2016-07-28 NOTE — Progress Notes (Signed)
PROGRESS NOTE  Anne Stark  [id number] DOB: 1951-10-14 DOA: 07/25/2016 PCP: Reginia Naas, MD  Brief Narrative:   Anne Stark is a 65 y.o. female with medical history significant of hypertension, hyperlipidemia, hypothyroidism, who presents with nausea, vomiting,  abdominal pain.  CT demonstrates SBO.  General surgery consulted.    Assessment & Plan:   Principal Problem:   SBO (small bowel obstruction) (HCC) Active Problems:   Hypothyroidism   HLD (hyperlipidemia)   Essential hypertension   Abdominal pain   Nausea & vomiting  SBO (small bowel obstruction) with nausea, vomiting, and loose stools:  CT abdomen/pelvis showed SBO due to a small bowel mass, stricture, or AVM.   Underwent open small bowel resection on 07/27/2016. -  IVF -  NG to LIWS -  Appreciate general surgery assistance  Hypothyroidism: TSH 0.621 on 07/25/2016 -  IV levothyroxine  HLD: hold statin while NPO  Hypertension -  IV hydralazine while NPO  DVT ppx: SCD Code Status: Full code Family Communication: bedside Disposition Plan:  Anticipate discharge back to previous home environment  Consultants:   General surgery  Procedures:  none  Antimicrobials:   perioperative  Subjective: Pt reports that NGT is not comfortable.  Denies nausea, SOB, and chest pain.      Objective: Vitals:   07/27/16 1346 07/27/16 2110 07/28/16 0631 07/28/16 1344  BP: 132/70 140/67 (!) 152/71 (!) 167/86  Pulse: 74 66 74 68  Resp: 17 18 18 18   Temp: 98.1 F (36.7 C) 98 F (36.7 C) 98.6 F (37 C) 98.7 F (37.1 C)  TempSrc: Oral Oral Oral Oral  SpO2: 95% 95% 98% 98%  Weight:      Height:        Intake/Output Summary (Last 24 hours) at 07/28/16 1404 Last data filed at 07/28/16 0941  Gross per 24 hour  Intake             1630 ml  Output             1650 ml  Net              -20 ml   Filed Weights   07/25/16 0338  Weight: 67.1 kg (148 lb)    Examination:  General exam:  Adult female.   No acute distress.  HEENT:  NCAT, MMM, NGT in place Respiratory system: Clear to auscultation bilaterally Cardiovascular system: Regular rate and rhythm, normal S1/S2. No murmurs, rubs, gallops or clicks.  Warm extremities Gastrointestinal system:  hypoactive BS.  Bandages c/d/i.  Mild guarding due to pain.   MSK:  Normal tone and bulk, no lower extremity edema Neuro:  Grossly intact  Data Reviewed: I have personally reviewed following labs and imaging studies  CBC:  Recent Labs Lab 07/25/16 0337 07/25/16 0348 07/26/16 0526  WBC 11.6*  --  5.8  NEUTROABS 10.2*  --   --   HGB 14.7 15.6* 11.7*  HCT 46.1* 46.0 36.7  MCV 93.3  --  94.6  PLT 406*  --  [id number]   Basic Metabolic Panel:  Recent Labs Lab 07/25/16 0337 07/25/16 0348 07/26/16 0526  NA 141 142 139  K 3.8 3.7 3.7  CL 106 104 111  CO2 28  --  23  GLUCOSE 117* 116* 108*  BUN 15 17 7   CREATININE 0.88 0.80 0.63  CALCIUM 9.3  --  7.7*   GFR: Estimated Creatinine Clearance: 59.1 mL/min (by C-G formula based on SCr of 0.8 mg/dL). Liver Function Tests:  Recent  Labs Lab 07/25/16 0337  AST 26  ALT 20  ALKPHOS 56  BILITOT 0.5  PROT 6.9  ALBUMIN 3.8    Recent Labs Lab 07/25/16 0337  LIPASE 26   No results for input(s): AMMONIA in the last 168 hours. Coagulation Profile:  Recent Labs Lab 07/25/16 0634  INR 1.00   Cardiac Enzymes: No results for input(s): CKTOTAL, CKMB, CKMBINDEX, TROPONINI in the last 168 hours. BNP (last 3 results) No results for input(s): PROBNP in the last 8760 hours. HbA1C: No results for input(s): HGBA1C in the last 72 hours. CBG:  Recent Labs Lab 07/27/16 0542 07/28/16 0830  GLUCAP 98 102*   Lipid Profile: No results for input(s): CHOL, HDL, LDLCALC, TRIG, CHOLHDL, LDLDIRECT in the last 72 hours. Thyroid Function Tests: No results for input(s): TSH, T4TOTAL, FREET4, T3FREE, THYROIDAB in the last 72 hours. Anemia Panel: No results for input(s): VITAMINB12, FOLATE,  FERRITIN, TIBC, IRON, RETICCTPCT in the last 72 hours. Urine analysis:    Component Value Date/Time   COLORURINE YELLOW 07/25/2016 0632   APPEARANCEUR CLEAR 07/25/2016 0632   LABSPEC 1.044 (H) 07/25/2016 0632   PHURINE 8.5 (H) 07/25/2016 0632   GLUCOSEU NEGATIVE 07/25/2016 0632   HGBUR NEGATIVE 07/25/2016 0632   BILIRUBINUR NEGATIVE 07/25/2016 0632   KETONESUR NEGATIVE 07/25/2016 0632   PROTEINUR NEGATIVE 07/25/2016 0632   NITRITE NEGATIVE 07/25/2016 0632   LEUKOCYTESUR SMALL (A) 07/25/2016 0632   Sepsis Labs: @LABRCNTIP (procalcitonin:4,lacticidven:4)  ) Recent Results (from the past 240 hour(s))  Surgical pcr screen     Status: Abnormal   Collection Time: 07/26/16 11:59 PM  Result Value Ref Range Status   MRSA, PCR NEGATIVE NEGATIVE Final   Staphylococcus aureus POSITIVE (A) NEGATIVE Final    Comment:        The Xpert SA Assay (FDA approved for NASAL specimens in patients over 73 years of age), is one component of a comprehensive surveillance program.  Test performance has been validated by [facility] for patients greater than or equal to 2 year old. It is not intended to diagnose infection nor to guide or monitor treatment.       Radiology Studies: No results found.   Scheduled Meds: . Chlorhexidine Gluconate Cloth  6 each Topical Daily  . enoxaparin (LOVENOX) injection  40 mg Subcutaneous Q24H  . levothyroxine  25 mcg Intravenous Daily  . mupirocin ointment  1 application Nasal BID   Continuous Infusions: . dextrose 5 % and 0.45 % NaCl with KCl 20 mEq/L 100 mL/hr at 07/28/16 1133  . dextrose 5 % and 0.9% NaCl      LOS: 3 days   Time spent: 30 min  Irwin Brakeman, MD [facility] Pager [phone]  If 7PM-7AM, please contact night-coverage [email or web address] Password [email or web address] 07/28/2016, 2:04 PM

## 2016-07-28 NOTE — Care Management Note (Signed)
Case Management Note  Patient Details  Name: Anne Stark MRN: [id number] Date of Birth: 08-06-1951  Subjective/Objective:          Admitted with n/v,abdominal pain/ SBO.  Hx of hypertension, hyperlipidemia, hypothyroidism.hypertension.Resides with husband. Independent with ADL's PTA, no DME usage.  - S/p small bowel resection on 07/27/2016  PCP: Carol Ada       Action/Plan: Return to home when medically stable. CM to f/u with disposition needs.  Expected Discharge Date:                  Expected Discharge Plan:  Home/Self Care  In-House Referral:     Discharge planning Services  CM Consult  Post Acute Care Choice:    Choice offered to:     DME Arranged:    DME Agency:     HH Arranged:    HH Agency:     Status of Service:  In process, will continue to follow  If discussed at Long Length of Stay Meetings, dates discussed:    Additional Comments:  Sharin Mons, RN 07/28/2016, 4:35 PM

## 2016-07-28 NOTE — Progress Notes (Signed)
[facility] Progress Note  1 Day Post-Op  Subjective: Laying in bed, NAD. Reports that her NGT stopped working after she sneezed. The NGT did not move out of place with the sneeze,tape remained in place, but "the acid started moving the wrong way". The NGT cording was switched to "off" so I turned it on and replaced the NGT to [facility] and it was working appropriately. She reports BL upper abdominal pain that is crampy and constant. +flatus. Urinating without hesitancy. Requests to ambulate to bathroom.   NGT ~250cc dark brown output Objective: Vital signs in last 24 hours: Temp:  [97.6 F (36.4 C)-98.6 F (37 C)] 98.6 F (37 C) (08/30 0631) Pulse Rate:  [61-74] 74 (08/30 0631) Resp:  [10-18] 18 (08/30 0631) BP: (129-152)/(66-71) 152/71 (08/30 0631) SpO2:  [95 %-100 %] 98 % (08/30 0631) Last BM Date: 07/24/16  Intake/Output from previous day: 08/29 0701 - 08/30 0700 In: 2650 [I.V.:2380; NG/GT:20; IV Piggyback:250] Out: 1975 [Urine:1700; Emesis/NG output:250; Blood:25] Intake/Output this shift: Total I/O In: -  Out: 250 [Urine:250]  PE: Gen:  Alert, NAD, pleasant Card:  RRR, no M/G/R heard Pulm:  CTA, no W/R/R Abd: Soft, appropriately tender, ND, +BS, incisions C/D/I, drain with minimal sanguinous drainage, no abdominal scars noted  Lab Results:   Recent Labs  07/26/16 0526  WBC 5.8  HGB 11.7*  HCT 36.7  PLT 301   BMET  Recent Labs  07/26/16 0526  NA 139  K 3.7  CL 111  CO2 23  GLUCOSE 108*  BUN 7  CREATININE 0.63  CALCIUM 7.7*   PT/INR No results for input(s): LABPROT, INR in the last 72 hours. CMP     Component Value Date/Time   NA 139 07/26/2016 0526   K 3.7 07/26/2016 0526   CL 111 07/26/2016 0526   CO2 23 07/26/2016 0526   GLUCOSE 108 (H) 07/26/2016 0526   BUN 7 07/26/2016 0526   CREATININE 0.63 07/26/2016 0526   CALCIUM 7.7 (L) 07/26/2016 0526   PROT 6.9 07/25/2016 0337   ALBUMIN 3.8 07/25/2016 0337   AST 26 07/25/2016 0337   ALT 20 07/25/2016 0337   ALKPHOS 56 07/25/2016 0337   BILITOT 0.5 07/25/2016 0337   GFRNONAA >60 07/26/2016 0526   GFRAA >60 07/26/2016 0526   Lipase     Component Value Date/Time   LIPASE 26 07/25/2016 0337    Studies/Results: No results found.  Anti-infectives: Anti-infectives    Start     Dose/Rate Route Frequency Ordered Stop   07/27/16 2100  cefoTEtan (CEFOTAN) 2 g in dextrose 5 % 50 mL IVPB     2 g 100 mL/hr over 30 Minutes Intravenous  Once 07/27/16 1147 07/27/16 2255   07/27/16 1000  cefoTEtan (CEFOTAN) 2 g in dextrose 5 % 50 mL IVPB  Status:  Discontinued     2 g 100 mL/hr over 30 Minutes Intravenous Every 12 hours 07/27/16 0800 07/27/16 1146       Assessment/Plan Small Bowel Obstruction - mass/stricure on CT POD#1 Diagnostic laparoscopy, small bowel resection with anastomosis, Dr. Rosendo Gros (07/27/16) - pain control, ambulate, IS - wait for surgical path  FEN: continue NGT to LIWS DVT Proph: lovenox, SCD's ID: [email or web address] once periop  Dispo: bowel rest   LOS: 3 days    Jill Alexanders , [facility] 07/28/2016, 10:02 AM Pager: [phone] Consults: [phone] Mon-Fri 7:00 am-4:30 pm Sat-Sun 7:00 am-11:30 am

## 2016-07-28 NOTE — Progress Notes (Signed)
Pt ambulated 340 ft on unit with standby assistance. Pt voiced no complaints and showed no distress during ambulation. Afterwards, pt sitting up in chair with call bell/phone within reach. Will continue to monitor pt.

## 2016-07-29 LAB — COMPREHENSIVE METABOLIC PANEL
ALBUMIN: 3.2 g/dL — AB (ref 3.5–5.0)
ALT: 20 U/L (ref 14–54)
ANION GAP: 6 (ref 5–15)
AST: 27 U/L (ref 15–41)
Alkaline Phosphatase: 52 U/L (ref 38–126)
BILIRUBIN TOTAL: 0.6 mg/dL (ref 0.3–1.2)
BUN: 5 mg/dL — ABNORMAL LOW (ref 6–20)
CALCIUM: 8.7 mg/dL — AB (ref 8.9–10.3)
CO2: 25 mmol/L (ref 22–32)
Chloride: 108 mmol/L (ref 101–111)
Creatinine, Ser: 0.69 mg/dL (ref 0.44–1.00)
GFR calc non Af Amer: >60 mL/min (ref >60)
GLUCOSE: 111 mg/dL — AB (ref 65–99)
POTASSIUM: 3.7 mmol/L (ref 3.5–5.1)
SODIUM: 139 mmol/L (ref 135–145)
TOTAL PROTEIN: 6.4 g/dL — AB (ref 6.5–8.1)

## 2016-07-29 LAB — CBC
HEMATOCRIT: 41.7 % (ref 36.0–46.0)
Hemoglobin: 13.5 g/dL (ref 12.0–15.0)
MCH: 29.9 pg (ref 26.0–34.0)
MCHC: 32.4 g/dL (ref 30.0–36.0)
MCV: 92.3 fL (ref 78.0–100.0)
Platelets: 379 10*3/uL (ref 150–400)
RBC: 4.52 MIL/uL (ref 3.87–5.11)
RDW: 13.2 % (ref 11.5–15.5)
WBC: 12.7 10*3/uL — ABNORMAL HIGH (ref 4.0–10.5)

## 2016-07-29 LAB — GLUCOSE, CAPILLARY: GLUCOSE-CAPILLARY: 90 mg/dL (ref 65–99)

## 2016-07-29 MED ORDER — ORAL CARE MOUTH RINSE
15.0000 mL | Freq: Two times a day (BID) | OROMUCOSAL | Status: DC
  Administered 2016-07-30 – 2016-08-02 (×5): 15 mL via OROMUCOSAL

## 2016-07-29 NOTE — Progress Notes (Signed)
[facility] Progress Note  2 Days Post-Op  Subjective: NAE overnight. Abdominal soreness is minimal. No complications with NGT. +flatus. NGT ~20cc/24h?  Objective: Vital signs in last 24 hours: Temp:  [97.9 F (36.6 C)-98.7 F (37.1 C)] 97.9 F (36.6 C) (08/31 [id number]) Pulse Rate:  [68-100] 100 (08/31 0608) Resp:  [18] 18 (08/31 0608) BP: (164-169)/(74-86) 164/74 (08/31 0608) SpO2:  [91 %-98 %] 95 % (08/31 0608) Last BM Date: 07/24/16  Intake/Output from previous day: 08/30 0701 - 08/31 0700 In: 1100 [I.V.:1100] Out: 1300 [Urine:1200; Emesis/NG output:100] Intake/Output this shift: No intake/output data recorded.  PE: Gen:  Alert, NAD, pleasant Card:  RRR, no M/G/R heard Pulm:  CTA, no W/R/R Abd: Soft, appropriately tender, ND, +BS, incision C/D/I  Lab Results:   Recent Labs  07/29/16 0756  WBC 12.7*  HGB 13.5  HCT 41.7  PLT 379   BMET  Recent Labs  07/29/16 0756  NA 139  K 3.7  CL 108  CO2 25  GLUCOSE 111*  BUN 5*  CREATININE 0.69  CALCIUM 8.7*   PT/INR No results for input(s): LABPROT, INR in the last 72 hours. CMP     Component Value Date/Time   NA 139 07/29/2016 0756   K 3.7 07/29/2016 0756   CL 108 07/29/2016 0756   CO2 25 07/29/2016 0756   GLUCOSE 111 (H) 07/29/2016 0756   BUN 5 (L) 07/29/2016 0756   CREATININE 0.69 07/29/2016 0756   CALCIUM 8.7 (L) 07/29/2016 0756   PROT 6.4 (L) 07/29/2016 0756   ALBUMIN 3.2 (L) 07/29/2016 0756   AST 27 07/29/2016 0756   ALT 20 07/29/2016 0756   ALKPHOS 52 07/29/2016 0756   BILITOT 0.6 07/29/2016 0756   GFRNONAA >60 07/29/2016 0756   GFRAA >60 07/29/2016 0756   Lipase     Component Value Date/Time   LIPASE 26 07/25/2016 0337   Studies/Results: No results found.  Anti-infectives: Anti-infectives    Start     Dose/Rate Route Frequency Ordered Stop   07/27/16 2100  cefoTEtan (CEFOTAN) 2 g in dextrose 5 % 50 mL IVPB     2 g 100 mL/hr over 30 Minutes Intravenous  Once 07/27/16 1147  07/27/16 2255   07/27/16 1000  cefoTEtan (CEFOTAN) 2 g in dextrose 5 % 50 mL IVPB  Status:  Discontinued     2 g 100 mL/hr over 30 Minutes Intravenous Every 12 hours 07/27/16 0800 07/27/16 1146     Assessment/Plan Small Bowel Obstruction - mass/stricure on CT POD#1 Diagnostic laparoscopy, small bowel resection with anastomosis, Dr. Rosendo Gros (07/27/16) - pain control, ambulate, IS - wait for surgical path: called pathology and they said the result should be released today.  FEN: continue NGT to LIWS DVT Proph: lovenox, SCD's ID: [email or web address] once periop  Dispo: bowel rest, d/c NGT tomorrow   LOS: 4 days    Jill Alexanders , [facility] 07/29/2016, 10:10 AM Pager: [phone] Consults: [phone] Mon-Fri 7:00 am-4:30 pm Sat-Sun 7:00 am-11:30 am

## 2016-07-29 NOTE — Progress Notes (Signed)
PROGRESS NOTE  Anne Stark  [id number] DOB: 05/24/1951 DOA: 07/25/2016 PCP: Reginia Naas, MD  Brief Narrative:   Anne Stark is a 65 y.o. female with medical history significant of hypertension, hyperlipidemia, hypothyroidism, who presents with nausea, vomiting,  abdominal pain.  CT demonstrates SBO.  General surgery consulted.    Assessment & Plan:   Principal Problem:   SBO (small bowel obstruction) (HCC) Active Problems:   Hypothyroidism   HLD (hyperlipidemia)   Essential hypertension   Abdominal pain   Nausea & vomiting  SBO (small bowel obstruction) with nausea, vomiting, and loose stools:  CT abdomen/pelvis showed SBO due to a small bowel mass, stricture, or AVM.   Underwent open small bowel resection on 07/27/2016. -  IVF -  NG to LIWS - minimal output recorded. -  Appreciate general surgery assistance  Hypothyroidism: TSH 0.621 on 07/25/2016 -  IV levothyroxine   HLD: hold statin while NPO  Hypertension -  IV hydralazine while NPO  DVT ppx: SCD Code Status: Full code Family Communication: bedside Disposition Plan:  Anticipate discharge back to previous home environment  Consultants:   General surgery  Procedures:  none  Antimicrobials:   perioperative  Subjective: Pt reports that she had back pain and had to get some pain med to relieve      Objective: Vitals:   07/28/16 0631 07/28/16 1344 07/28/16 2138 07/29/16 0608  BP: (!) 152/71 (!) 167/86 (!) 169/79 (!) 164/74  Pulse: 74 68 83 100  Resp: 18 18 18 18   Temp: 98.6 F (37 C) 98.7 F (37.1 C) 98.6 F (37 C) 97.9 F (36.6 C)  TempSrc: Oral Oral Oral Oral  SpO2: 98% 98% 91% 95%  Weight:      Height:        Intake/Output Summary (Last 24 hours) at 07/29/16 1024 Last data filed at 07/29/16 0900  Gross per 24 hour  Intake             1100 ml  Output             1050 ml  Net               50 ml   Filed Weights   07/25/16 0338  Weight: 67.1 kg (148 lb)     Examination:  General exam:  Adult female.  No acute distress.  HEENT:  NCAT, MMM, NGT in place Respiratory system: Clear to auscultation bilaterally Cardiovascular system: Regular rate and rhythm, normal S1/S2. No murmurs, rubs, gallops or clicks.  Warm extremities Gastrointestinal system:  hypoactive BS.  Bandages c/d/i.  Mild guarding due to pain.   MSK:  Normal tone and bulk, no lower extremity edema Neuro:  Grossly intact  Data Reviewed: I have personally reviewed following labs and imaging studies  CBC:  Recent Labs Lab 07/25/16 0337 07/25/16 0348 07/26/16 0526 07/29/16 0756  WBC 11.6*  --  5.8 12.7*  NEUTROABS 10.2*  --   --   --   HGB 14.7 15.6* 11.7* 13.5  HCT 46.1* 46.0 36.7 41.7  MCV 93.3  --  94.6 92.3  PLT 406*  --  301 [id number]   Basic Metabolic Panel:  Recent Labs Lab 07/25/16 0337 07/25/16 0348 07/26/16 0526 07/29/16 0756  NA 141 142 139 139  K 3.8 3.7 3.7 3.7  CL 106 104 111 108  CO2 28  --  23 25  GLUCOSE 117* 116* 108* 111*  BUN 15 17 7  5*  CREATININE 0.88 0.80 0.63 0.69  CALCIUM 9.3  --  7.7* 8.7*   GFR: Estimated Creatinine Clearance: 59.1 mL/min (by C-G formula based on SCr of 0.8 mg/dL). Liver Function Tests:  Recent Labs Lab 07/25/16 0337 07/29/16 0756  AST 26 27  ALT 20 20  ALKPHOS 56 52  BILITOT 0.5 0.6  PROT 6.9 6.4*  ALBUMIN 3.8 3.2*    Recent Labs Lab 07/25/16 0337  LIPASE 26   No results for input(s): AMMONIA in the last 168 hours. Coagulation Profile:  Recent Labs Lab 07/25/16 0634  INR 1.00   Cardiac Enzymes: No results for input(s): CKTOTAL, CKMB, CKMBINDEX, TROPONINI in the last 168 hours. BNP (last 3 results) No results for input(s): PROBNP in the last 8760 hours. HbA1C: No results for input(s): HGBA1C in the last 72 hours. CBG:  Recent Labs Lab 07/27/16 0542 07/28/16 0830 07/29/16 0956  GLUCAP 98 102* 90   Lipid Profile: No results for input(s): CHOL, HDL, LDLCALC, TRIG, CHOLHDL, LDLDIRECT in  the last 72 hours. Thyroid Function Tests: No results for input(s): TSH, T4TOTAL, FREET4, T3FREE, THYROIDAB in the last 72 hours. Anemia Panel: No results for input(s): VITAMINB12, FOLATE, FERRITIN, TIBC, IRON, RETICCTPCT in the last 72 hours. Urine analysis:    Component Value Date/Time   COLORURINE YELLOW 07/25/2016 0632   APPEARANCEUR CLEAR 07/25/2016 0632   LABSPEC 1.044 (H) 07/25/2016 0632   PHURINE 8.5 (H) 07/25/2016 0632   GLUCOSEU NEGATIVE 07/25/2016 0632   HGBUR NEGATIVE 07/25/2016 0632   BILIRUBINUR NEGATIVE 07/25/2016 0632   KETONESUR NEGATIVE 07/25/2016 0632   PROTEINUR NEGATIVE 07/25/2016 0632   NITRITE NEGATIVE 07/25/2016 0632   LEUKOCYTESUR SMALL (A) 07/25/2016 0632   Sepsis Labs: @LABRCNTIP (procalcitonin:4,lacticidven:4)  ) Recent Results (from the past 240 hour(s))  Surgical pcr screen     Status: Abnormal   Collection Time: 07/26/16 11:59 PM  Result Value Ref Range Status   MRSA, PCR NEGATIVE NEGATIVE Final   Staphylococcus aureus POSITIVE (A) NEGATIVE Final    Comment:        The Xpert SA Assay (FDA approved for NASAL specimens in patients over 72 years of age), is one component of a comprehensive surveillance program.  Test performance has been validated by [facility] for patients greater than or equal to 57 year old. It is not intended to diagnose infection nor to guide or monitor treatment.       Radiology Studies: No results found.   Scheduled Meds: . Chlorhexidine Gluconate Cloth  6 each Topical Daily  . enoxaparin (LOVENOX) injection  40 mg Subcutaneous Q24H  . levothyroxine  25 mcg Intravenous Daily  . mouth rinse  15 mL Mouth Rinse BID  . mupirocin ointment  1 application Nasal BID   Continuous Infusions: . dextrose 5 % and 0.9% NaCl 100 mL/hr at 07/29/16 0953    LOS: 4 days   Time spent: 24 min  Irwin Brakeman, MD [facility] Pager [phone]  If 7PM-7AM, please contact night-coverage [email or web address] Password  [facility] 07/29/2016, 10:24 AM

## 2016-07-29 NOTE — Evaluation (Signed)
Physical Therapy Evaluation Patient Details Name: Anne Stark MRN: [id number] DOB: 14-Sep-1951 Today's Date: 8/31/20174   History of Present Illness  pt is a 65 y/o female with h/o HTN admitted with abdominal pain nausea and vomiting found to be due to SBO by CT.  Pt s/p Exp Lap with Small Bowel Resection.  Clinical Impression  Pt admitted with/for abdominal pain, n/v due to SBO.  Pt currently limited functionally due to the problems listed below.  (see problems list.)  Pt will benefit from PT to maximize function and safety to be able to get home safely with available assist of family.     Follow Up Recommendations Home health PT;Supervision for mobility/OOB    Equipment Recommendations  None recommended by PT (may need RW prior to d/c)    Recommendations for Other Services       Precautions / Restrictions Precautions Precautions: Fall      Mobility  Bed Mobility Overal bed mobility: Needs Assistance Bed Mobility: Supine to Sit;Sit to Sidelying     Supine to sit: Mod assist;HOB elevated   Sit to sidelying: Mod assist General bed mobility comments: Discussed need to practice transitions from sidelying  to/from sitting EOB  Transfers Overall transfer level: Needs assistance Equipment used: Rolling walker (2 wheeled) Transfers: Sit to/from Stand Sit to Stand: Min assist         General transfer comment: cues for hand placement  Ambulation/Gait Ambulation/Gait assistance: Min guard Ambulation Distance (Feet): 240 Feet Assistive device: Rolling walker (2 wheeled) Gait Pattern/deviations: Step-through pattern Gait velocity: slower Gait velocity interpretation: Below normal speed for age/gender General Gait Details: generally steady with episodes of unsteadiness using RW  Stairs Stairs: Yes Stairs assistance: Min guard Stair Management: One rail Right;Step to pattern;Forwards Number of Stairs: 4 General stair comments: pt talked about her L LE being weak, but  used it for negotiating stairs  Wheelchair Mobility    Modified Rankin (Stroke Patients Only)       Balance Overall balance assessment: Needs assistance Sitting-balance support: No upper extremity supported Sitting balance-Leahy Scale: Good     Standing balance support: Single extremity supported;Bilateral upper extremity supported Standing balance-Leahy Scale: Poor Standing balance comment: reliant on assist                             Pertinent Vitals/Pain Pain Assessment: Faces Faces Pain Scale: Hurts even more Pain Location: back pain worse than incisions Pain Descriptors / Indicators: Aching;Sore Pain Intervention(s): Monitored during session;Repositioned    Home Living Family/patient expects to be discharged to:: Private residence Living Arrangements: Spouse/significant other Available Help at Discharge: Family;Available 24 hours/day (Husband is a Company secretary with flexible schedule) Type of Home: House Home Access: Stairs to enter Entrance Stairs-Rails: Left;Right;Can reach both Technical brewer of Steps: 4-5 Home Layout: One level Home Equipment: None      Prior Function Level of Independence: Independent               Hand Dominance        Extremity/Trunk Assessment   Upper Extremity Assessment: Defer to OT evaluation           Lower Extremity Assessment: Generalized weakness;Overall WFL for tasks assessed (L LE caused more incisional pain with MMT)         Communication   Communication: No difficulties  Cognition Arousal/Alertness: Awake/alert Behavior During Therapy: WFL for tasks assessed/performed Overall Cognitive Status: Within Functional Limits for tasks assessed  General Comments      Exercises        Assessment/Plan    PT Assessment Patient needs continued PT services  PT Diagnosis Difficulty walking;Acute pain;Generalized weakness   PT Problem List Decreased  strength;Decreased activity tolerance;Decreased mobility;Pain;Decreased balance  PT Treatment Interventions DME instruction;Gait training;Functional mobility training;Therapeutic activities;Balance training;Patient/family education   PT Goals (Current goals can be found in the Care Plan section) Acute Rehab PT Goals Patient Stated Goal: Be independent PT Goal Formulation: With patient Time For Goal Achievement: 08/05/16 Potential to Achieve Goals: Good    Frequency Min 3X/week   Barriers to discharge        Co-evaluation               End of Session   Activity Tolerance: Patient tolerated treatment well Patient left: in bed;with call bell/phone within reach;with bed alarm set Nurse Communication: Mobility status         Time: [id number] PT Time Calculation (min) (ACUTE ONLY): 37 min   Charges:   PT Evaluation $PT Eval Moderate Complexity: 1 Procedure PT Treatments $Gait Training: 8-22 mins   PT G Codes:        Davonda Ausley, Tessie Fass 07/29/2016, 5:01 PM 07/29/2016  Donnella Sham, PT [phone]  (pager)

## 2016-07-30 LAB — BASIC METABOLIC PANEL
ANION GAP: 5 (ref 5–15)
BUN: 7 mg/dL (ref 6–20)
CALCIUM: 8.2 mg/dL — AB (ref 8.9–10.3)
CO2: 26 mmol/L (ref 22–32)
CREATININE: 0.59 mg/dL (ref 0.44–1.00)
Chloride: 109 mmol/L (ref 101–111)
Glucose, Bld: 117 mg/dL — ABNORMAL HIGH (ref 65–99)
Potassium: 3.3 mmol/L — ABNORMAL LOW (ref 3.5–5.1)
SODIUM: 140 mmol/L (ref 135–145)

## 2016-07-30 LAB — MAGNESIUM: MAGNESIUM: 1.8 mg/dL (ref 1.7–2.4)

## 2016-07-30 LAB — GLUCOSE, CAPILLARY: GLUCOSE-CAPILLARY: 92 mg/dL (ref 65–99)

## 2016-07-30 MED ORDER — POTASSIUM CHLORIDE 10 MEQ/100ML IV SOLN
10.0000 meq | INTRAVENOUS | Status: AC
  Administered 2016-07-30 (×4): 10 meq via INTRAVENOUS
  Filled 2016-07-30 (×4): qty 100

## 2016-07-30 MED ORDER — POTASSIUM CHLORIDE CRYS ER 20 MEQ PO TBCR: 40.0000 meq | EXTENDED_RELEASE_TABLET | Freq: Once | ORAL | Status: DC

## 2016-07-30 NOTE — Progress Notes (Signed)
Physical Therapy Treatment Patient Details Name: Anne Stark MRN: [id number] DOB: 11/12/1951 Today's Date: 01-Aug-2016    History of Present Illness pt is a 65 y/o female with h/o HTN admitted with abdominal pain nausea and vomiting found to be due to SBO by CT.  Pt s/p Exp Lap with Small Bowel Resection.    PT Comments    Patient progressing with less UE support for ambulation and transfers this session.  Still limited by fatigue and needs assist for safety, but agree with plan for home with family support and HHPT.   Follow Up Recommendations  Home health PT;Supervision for mobility/OOB     Equipment Recommendations  None recommended by PT    Recommendations for Other Services       Precautions / Restrictions Precautions Precautions: Fall Precaution Comments: NG    Mobility  Bed Mobility Overal bed mobility: Needs Assistance       Supine to sit: HOB elevated;Min assist   Sit to sidelying: Min guard General bed mobility comments: patient initiated with LE's, assist for lifting trunk, assisted with guiding feet into bed  Transfers Overall transfer level: Needs assistance Equipment used: None Transfers: Sit to/from Stand Sit to Stand: Min assist         General transfer comment: assist for balance  Ambulation/Gait Ambulation/Gait assistance: Min assist Ambulation Distance (Feet): 240 Feet Assistive device: None (IV pole) Gait Pattern/deviations: Step-through pattern;Decreased stride length;Wide base of support     General Gait Details: increased lateral sway, pushed IV pole    Stairs            Wheelchair Mobility    Modified Rankin (Stroke Patients Only)       Balance Overall balance assessment: Needs assistance         Standing balance support: No upper extremity supported Standing balance-Leahy Scale: Good Standing balance comment: standing without UE support no physical help                    Cognition Arousal/Alertness:  Awake/alert Behavior During Therapy: WFL for tasks assessed/performed (tearful from recent diagnosis) Overall Cognitive Status: Within Functional Limits for tasks assessed                      Exercises      General Comments        Pertinent Vitals/Pain Faces Pain Scale: Hurts even more Pain Location: back and abdomen Pain Descriptors / Indicators: Discomfort;Sore Pain Intervention(s): Monitored during session;Repositioned    Home Living                      Prior Function            PT Goals (current goals can now be found in the care plan section) Progress towards PT goals: Progressing toward goals    Frequency  Min 3X/week    PT Plan Current plan remains appropriate    Co-evaluation             End of Session   Activity Tolerance: Patient limited by fatigue Patient left: in bed;with call bell/phone within reach     Time: [id number] PT Time Calculation (min) (ACUTE ONLY): 20 min  Charges:  $Gait Training: 8-22 mins                    G Codes:      Anne Stark 01-Aug-2016, 2:09 PM  Anne Stark, [place] 08-01-2016

## 2016-07-30 NOTE — Progress Notes (Signed)
[facility] Progress Note  3 Days Post-Op  Subjective: Sitting in bed with pathology report on her lap - in obvious distress. Denies abdominal pain - just mild soreness. +flatus. No BM. Reports nose and throat discomfort from NGT. Denies CP, SOB, palpitations, and night sweats.   Objective: Vital signs in last 24 hours: Temp:  [98.2 F (36.8 C)-98.5 F (36.9 C)] 98.2 F (36.8 C) (09/01 0539) Pulse Rate:  [75-91] 75 (09/01 0539) Resp:  [16-19] 18 (09/01 0539) BP: (141-155)/(67-78) 150/75 (09/01 0539) SpO2:  [93 %-97 %] 97 % (09/01 0539) Last BM Date: 07/24/16  Intake/Output from previous day: 08/31 0701 - 09/01 0700 In: 711.7 [I.V.:711.7] Out: 150 [Urine:150] Intake/Output this shift: Total I/O In: 0  Out: 675 [Urine:600; Emesis/NG output:75]  PE: Gen:  Alert, NAD, pleasant Abd: Soft, appropriately tender, ND, +BS all 4 quadrants, no HSM, incisions C/D/I  Lab Results:   Recent Labs  07/29/16 0756  WBC 12.7*  HGB 13.5  HCT 41.7  PLT 379   BMET  Recent Labs  07/29/16 0756 07/30/16 0625  NA 139 140  K 3.7 3.3*  CL 108 109  CO2 25 26  GLUCOSE 111* 117*  BUN 5* 7  CREATININE 0.69 0.59  CALCIUM 8.7* 8.2*   PT/INR No results for input(s): LABPROT, INR in the last 72 hours. CMP     Component Value Date/Time   NA 140 07/30/2016 0625   K 3.3 (L) 07/30/2016 0625   CL 109 07/30/2016 0625   CO2 26 07/30/2016 0625   GLUCOSE 117 (H) 07/30/2016 0625   BUN 7 07/30/2016 0625   CREATININE 0.59 07/30/2016 0625   CALCIUM 8.2 (L) 07/30/2016 0625   PROT 6.4 (L) 07/29/2016 0756   ALBUMIN 3.2 (L) 07/29/2016 0756   AST 27 07/29/2016 0756   ALT 20 07/29/2016 0756   ALKPHOS 52 07/29/2016 0756   BILITOT 0.6 07/29/2016 0756   GFRNONAA >60 07/30/2016 0625   GFRAA >60 07/30/2016 0625   Lipase     Component Value Date/Time   LIPASE 26 07/25/2016 0337       Studies/Results: No results found.  Anti-infectives: Anti-infectives    Start      Dose/Rate Route Frequency Ordered Stop   07/27/16 2100  cefoTEtan (CEFOTAN) 2 g in dextrose 5 % 50 mL IVPB     2 g 100 mL/hr over 30 Minutes Intravenous  Once 07/27/16 1147 07/27/16 2255   07/27/16 1000  cefoTEtan (CEFOTAN) 2 g in dextrose 5 % 50 mL IVPB  Status:  Discontinued     2 g 100 mL/hr over 30 Minutes Intravenous Every 12 hours 07/27/16 0800 07/27/16 1146       Assessment/Plan Small Bowel Obstruction - mass/stricure on CT POD#1 Diagnostic laparoscopy, small bowel resection with anastomosis, Dr. Rosendo Gros (07/27/16) - pain control, ambulate, IS - wait for surgical path: well differentiated neuroendocrine tumor (carcinoid) with 1/3 nodes positive  - medicine consulted oncology on 9/1 (Dr. Lindi Adie)  FEN: continue NGT to LIWS DVT Proph: lovenox, SCD's ID: [email or web address] once periop  Dispo: clamp NGT, sips of clears  Will d/c NGT later today if tolerating diet.   LOS: 5 days    Jill Alexanders , [facility] 07/30/2016, 11:50 AM Pager: [phone] Consults: [phone] Mon-Fri 7:00 am-4:30 pm Sat-Sun 7:00 am-11:30 am

## 2016-07-30 NOTE — Progress Notes (Signed)
PROGRESS NOTE  Anne Stark  [id number] DOB: [birth year] DOA: 07/25/2016 PCP: Reginia Naas, MD  Brief Narrative:   Anne Stark is a 65 y.o. female with medical history significant of hypertension, hyperlipidemia, hypothyroidism, who presents with nausea, vomiting,  abdominal pain.  CT demonstrates SBO.  General surgery consulted.   Biopsies reveal carcinoid tumor.  Oncologist consulted 9/1.   Assessment & Plan:   Principal Problem:   SBO (small bowel obstruction) (HCC) Active Problems:   Hypothyroidism   HLD (hyperlipidemia)   Essential hypertension   Abdominal pain   Nausea & vomiting  SBO (small bowel obstruction), with mass;  nausea, vomiting, and loose stools:  CT abdomen/pelvis showed SBO due to a small bowel mass Underwent open small bowel resection on 07/27/2016. - NGT likely to be discontinued 9/1 per surgery.  - biopsy reveals carcinoid tumor :  Small intestine, resection for tumor, Mid Ileum - WELL DIFFERENTIATED NEUROENDOCRINE TUMOR (CARCINOID), SPANNING 1.5 CM. - TUMOR PENETRATES SEROSA. - RESECTION MARGINS ARE NEGATIVE. - METASTATIC TUMOR IN ONE OF THREE LYMPH NODES (1/3).  I called and requested oncology consult Dr. Lindi Adie  Hypothyroidism: TSH 0.621 on 07/25/2016 -  IV levothyroxine while NPO  HLD: hold statin while NPO  Hypertension -  IV hydralazine while NPO  Hypokalemia -Replete IV, check magnesium  DVT ppx: SCD Code Status: Full code Family Communication: bedside Disposition Plan:  Anticipate discharge back to previous home environment  Consultants:   General surgery  Oncology Lindi Adie)  Procedures:  none  Antimicrobials:   perioperative  Subjective: Pt reports that she passing flatus.  No BM. No n/v/d.     Objective: Vitals:   07/29/16 0608 07/29/16 1322 07/29/16 2151 07/30/16 0539  BP: (!) 164/74 (!) 155/78 (!) 141/67 (!) 150/75  Pulse: 100 91 80 75  Resp: 18 16 19 18   Temp: 97.9 F (36.6 C) 98.5 F (36.9 C)  98.3 F (36.8 C) 98.2 F (36.8 C)  TempSrc: Oral Oral Oral Oral  SpO2: 95% 97% 93% 97%  Weight:      Height:        Intake/Output Summary (Last 24 hours) at 07/30/16 1001 Last data filed at 07/30/16 0028  Gross per 24 hour  Intake           711.67 ml  Output              150 ml  Net           561.67 ml   Filed Weights   07/25/16 0338  Weight: 67.1 kg (148 lb)    Examination:  General exam:  Adult female.  No acute distress.  HEENT:  NCAT, MMM, NGT in place Respiratory system: Clear to auscultation bilaterally Cardiovascular system: Regular rate and rhythm, normal S1/S2. No murmurs, rubs, gallops or clicks.  Warm extremities Gastrointestinal system:  hypoactive BS.  Bandages c/d/i.  Mild guarding due to pain.   MSK:  Normal tone and bulk, no lower extremity edema Neuro:  Grossly intact  Data Reviewed: I have personally reviewed following labs and imaging studies  CBC:  Recent Labs Lab 07/25/16 0337 07/25/16 0348 07/26/16 0526 07/29/16 0756  WBC 11.6*  --  5.8 12.7*  NEUTROABS 10.2*  --   --   --   HGB 14.7 15.6* 11.7* 13.5  HCT 46.1* 46.0 36.7 41.7  MCV 93.3  --  94.6 92.3  PLT 406*  --  301 [id number]   Basic Metabolic Panel:  Recent Labs Lab 07/25/16 0337 07/25/16 0348  07/26/16 0526 07/29/16 0756 07/30/16 0625  NA 141 142 139 139 140  K 3.8 3.7 3.7 3.7 3.3*  CL 106 104 111 108 109  CO2 28  --  23 25 26   GLUCOSE 117* 116* 108* 111* 117*  BUN 15 17 7  5* 7  CREATININE 0.88 0.80 0.63 0.69 0.59  CALCIUM 9.3  --  7.7* 8.7* 8.2*   GFR: Estimated Creatinine Clearance: 59.1 mL/min (by C-G formula based on SCr of 0.8 mg/dL). Liver Function Tests:  Recent Labs Lab 07/25/16 0337 07/29/16 0756  AST 26 27  ALT 20 20  ALKPHOS 56 52  BILITOT 0.5 0.6  PROT 6.9 6.4*  ALBUMIN 3.8 3.2*    Recent Labs Lab 07/25/16 0337  LIPASE 26   No results for input(s): AMMONIA in the last 168 hours. Coagulation Profile:  Recent Labs Lab 07/25/16 0634  INR 1.00    Cardiac Enzymes: No results for input(s): CKTOTAL, CKMB, CKMBINDEX, TROPONINI in the last 168 hours. BNP (last 3 results) No results for input(s): PROBNP in the last 8760 hours. HbA1C: No results for input(s): HGBA1C in the last 72 hours. CBG:  Recent Labs Lab 07/27/16 0542 07/28/16 0830 07/29/16 0956 07/30/16 0752  GLUCAP 98 102* 90 92   Lipid Profile: No results for input(s): CHOL, HDL, LDLCALC, TRIG, CHOLHDL, LDLDIRECT in the last 72 hours. Thyroid Function Tests: No results for input(s): TSH, T4TOTAL, FREET4, T3FREE, THYROIDAB in the last 72 hours. Anemia Panel: No results for input(s): VITAMINB12, FOLATE, FERRITIN, TIBC, IRON, RETICCTPCT in the last 72 hours. Urine analysis:    Component Value Date/Time   COLORURINE YELLOW 07/25/2016 [place] 07/25/2016 0632   LABSPEC 1.044 (H) 07/25/2016 0632   PHURINE 8.5 (H) 07/25/2016 0632   GLUCOSEU NEGATIVE 07/25/2016 0632   HGBUR NEGATIVE 07/25/2016 0632   BILIRUBINUR NEGATIVE 07/25/2016 0632   KETONESUR NEGATIVE 07/25/2016 0632   PROTEINUR NEGATIVE 07/25/2016 0632   NITRITE NEGATIVE 07/25/2016 0632   LEUKOCYTESUR SMALL (A) 07/25/2016 [id number]   ) Recent Results (from the past 240 hour(s))  Surgical pcr screen     Status: Abnormal   Collection Time: 07/26/16 11:59 PM  Result Value Ref Range Status   MRSA, PCR NEGATIVE NEGATIVE Final   Staphylococcus aureus POSITIVE (A) NEGATIVE Final    Comment:        The Xpert SA Assay (FDA approved for NASAL specimens in patients over 67 years of age), is one component of a comprehensive surveillance program.  Test performance has been validated by [facility] for patients greater than or equal to 5 year old. It is not intended to diagnose infection nor to guide or monitor treatment.     Radiology Studies: No results found.   Scheduled Meds: . Chlorhexidine Gluconate Cloth  6 each Topical Daily  . enoxaparin (LOVENOX) injection  40 mg Subcutaneous Q24H   . levothyroxine  25 mcg Intravenous Daily  . mouth rinse  15 mL Mouth Rinse BID  . mupirocin ointment  1 application Nasal BID   Continuous Infusions: . dextrose 5 % and 0.9% NaCl 1,000 mL (07/30/16 0424)    LOS: 5 days   Time spent: 28 min  Irwin Brakeman, MD [facility] Pager [phone]  If 7PM-7AM, please contact night-coverage [email or web address] Password [email or web address] 07/30/2016, 10:01 AM

## 2016-07-31 DIAGNOSIS — C7A1 Malignant poorly differentiated neuroendocrine tumors: Secondary | ICD-10-CM

## 2016-07-31 LAB — GLUCOSE, CAPILLARY: GLUCOSE-CAPILLARY: 103 mg/dL — AB (ref 65–99)

## 2016-07-31 MED ORDER — ADULT MULTIVITAMIN W/MINERALS CH
1.0000 | ORAL_TABLET | Freq: Every day | ORAL | Status: DC
  Administered 2016-07-31 – 2016-08-02 (×3): 1 via ORAL
  Filled 2016-07-31 (×3): qty 1

## 2016-07-31 MED ORDER — POTASSIUM CHLORIDE CRYS ER 10 MEQ PO TBCR
10.0000 meq | EXTENDED_RELEASE_TABLET | Freq: Every day | ORAL | Status: DC
  Administered 2016-07-31 – 2016-08-01 (×2): 10 meq via ORAL
  Filled 2016-07-31 (×2): qty 1

## 2016-07-31 MED ORDER — LEVOTHYROXINE SODIUM 50 MCG PO TABS
50.0000 ug | ORAL_TABLET | Freq: Every day | ORAL | Status: DC
  Administered 2016-08-01 – 2016-08-02 (×2): 50 ug via ORAL
  Filled 2016-07-31 (×2): qty 1

## 2016-07-31 MED ORDER — ASPIRIN EC 81 MG PO TBEC
81.0000 mg | DELAYED_RELEASE_TABLET | Freq: Every day | ORAL | Status: DC
  Administered 2016-08-01 – 2016-08-02 (×2): 81 mg via ORAL
  Filled 2016-07-31 (×2): qty 1

## 2016-07-31 MED ORDER — FUROSEMIDE 20 MG PO TABS
20.0000 mg | ORAL_TABLET | Freq: Every day | ORAL | Status: DC
  Administered 2016-08-01 – 2016-08-02 (×2): 20 mg via ORAL
  Filled 2016-07-31 (×2): qty 1

## 2016-07-31 MED ORDER — LORATADINE 10 MG PO TABS
10.0000 mg | ORAL_TABLET | Freq: Every day | ORAL | Status: DC
  Administered 2016-07-31 – 2016-08-02 (×3): 10 mg via ORAL
  Filled 2016-07-31 (×3): qty 1

## 2016-07-31 MED ORDER — ATORVASTATIN CALCIUM 10 MG PO TABS
10.0000 mg | ORAL_TABLET | Freq: Every day | ORAL | Status: DC
  Administered 2016-08-01 – 2016-08-02 (×2): 10 mg via ORAL
  Filled 2016-07-31 (×2): qty 1

## 2016-07-31 MED ORDER — HYDROMORPHONE HCL 1 MG/ML IJ SOLN: 0.5000 mg | INTRAMUSCULAR | Status: DC | PRN

## 2016-07-31 NOTE — Consult Note (Signed)
Anne Stark CONSULT NOTE  Patient Care Team: Carol Ada, MD as PCP - General (Family Medicine)  CHIEF COMPLAINTS/PURPOSE OF CONSULTATION:  Well-differentiated carcinoid tumor of the small intestines  HISTORY OF PRESENTING ILLNESS:  Anne Stark 65 y.o. female is admitted to the hospital because of abdominal pain with nausea and vomiting. CT of the abdomen was performed which revealed small bowel obstruction. She apparently had a negative colonoscopy a few years ago. She denied any blood in the stool. CT of the abdomen showed a transition point small understands concerning for small bowel obstruction due to a mass. Patient was seen by Dr. Rosendo Gros who performed small bowel resection with anastomosis on 07/27/2016. The pathology revealed that this was a well-differentiated carcinoid tumor with 1 positive lymph node out of 3 and also serosal invasion. Patient appears to be recovering fairly well. She does not complain of any pain today.  I reviewed her records extensively and collaborated the history with the patient.  MEDICAL HISTORY:  Past Medical History:  Diagnosis Date  . Hypertension   . Hypothyroidism   . Thyroid disease     SURGICAL HISTORY: Past Surgical History:  Procedure Laterality Date  . BOWEL RESECTION N/A 07/27/2016   Procedure: SMALL BOWEL RESECTION;  Surgeon: Ralene Ok, MD;  Location: [facility];  Service: General;  Laterality: N/A;  . HEMORRHOID SURGERY    . LAPAROSCOPY N/A 07/27/2016   Procedure: LAPAROSCOPY DIAGNOSTIC;  Surgeon: Ralene Ok, MD;  Location: [place];  Service: General;  Laterality: N/A;  . LAPAROTOMY N/A 07/27/2016   Procedure: EXPLORATORY LAPAROTOMY;  Surgeon: Ralene Ok, MD;  Location: [place];  Service: General;  Laterality: N/A;    SOCIAL HISTORY: Social History   Social History  . Marital status: Married    Spouse name: N/A  . Number of children: N/A  . Years of education: N/A   Occupational History  . Not on file.    Social History Main Topics  . Smoking status: Never Smoker  . Smokeless tobacco: Never Used  . Alcohol use No  . Drug use: No  . Sexual activity: Not on file   Other Topics Concern  . Not on file   Social History Narrative  . No narrative on file    FAMILY HISTORY: Family History  Problem Relation Age of Onset  . Breast cancer Mother   . Heart disease Mother   . COPD Father   . Breast cancer Sister     ALLERGIES:  has No Known Allergies.  MEDICATIONS:  Current Facility-Administered Medications  Medication Dose Route Frequency Provider Last Rate Last Dose  . acetaminophen (TYLENOL) tablet 650 mg  650 mg Oral Q6H PRN Ivor Costa, MD       Or  . acetaminophen (TYLENOL) suppository 650 mg  650 mg Rectal Q6H PRN Ivor Costa, MD      . albuterol (PROVENTIL) (2.5 MG/3ML) 0.083% nebulizer solution 2.5 mg  2.5 mg Nebulization Q6H PRN Ivor Costa, MD      . azelastine (ASTELIN) 0.1 % nasal spray 1 spray  1 spray Each Nare BID PRN Ivor Costa, MD      . Chlorhexidine Gluconate Cloth 2 % PADS 6 each  6 each Topical Daily Janece Canterbury, MD   6 each at 07/30/16 1437  . dextrose 5 %-0.9 % sodium chloride infusion   Intravenous Continuous Ralene Ok, MD 100 mL/hr at 07/31/16 0411 1,000 mL at 07/31/16 0411  . enoxaparin (LOVENOX) injection 40 mg  40 mg Subcutaneous  Q24H Ralene Ok, MD   40 mg at 07/30/16 2200  . hydrALAZINE (APRESOLINE) injection 10 mg  10 mg Intravenous Q4H PRN Janece Canterbury, MD   10 mg at 07/29/16 0630  . HYDROmorphone (DILAUDID) injection 1-2 mg  1-2 mg Intravenous Q3H PRN Ralene Ok, MD   1 mg at 07/30/16 0420  . levothyroxine (SYNTHROID, LEVOTHROID) injection 25 mcg  25 mcg Intravenous Daily Ivor Costa, MD   25 mcg at 07/30/16 1057  . MEDLINE mouth rinse  15 mL Mouth Rinse BID Clanford L Johnson, MD   15 mL at 07/30/16 2200  . morphine 2 MG/ML injection 2 mg  2 mg Intravenous Q4H PRN Ivor Costa, MD      . mupirocin ointment (BACTROBAN) 2 % 1 application  1  application Nasal BID Janece Canterbury, MD   1 application at 0000000 2200  . ondansetron (ZOFRAN) injection 4 mg  4 mg Intravenous Q8H PRN Ivor Costa, MD      . ondansetron (ZOFRAN-ODT) disintegrating tablet 4 mg  4 mg Oral Q6H PRN Ralene Ok, MD       Or  . ondansetron [facility]) injection 4 mg  4 mg Intravenous Q6H PRN Ralene Ok, MD      . oxyCODONE-acetaminophen (PERCOCET/ROXICET) 5-325 MG per tablet 1-2 tablet  1-2 tablet Oral Q4H PRN Ralene Ok, MD        REVIEW OF SYSTEMS:   Constitutional: Denies fevers, chills or abnormal night sweats Eyes: Denies blurriness of vision, double vision or watery eyes Ears, nose, mouth, throat, and face: Denies mucositis or sore throat Respiratory: Denies cough, dyspnea or wheezes Cardiovascular: Denies palpitation, chest discomfort or lower extremity swelling Gastrointestinal:  Recent abdominal surgery Skin: Denies abnormal skin rashes Lymphatics: Denies new lymphadenopathy or easy bruising Neurological:Denies numbness, tingling or new weaknesses Behavioral/Psych: Mood is stable, no new changes   All other systems were reviewed with the patient and are negative.  PHYSICAL EXAMINATION: ECOG PERFORMANCE STATUS: 1 - Symptomatic but completely ambulatory  Vitals:   07/30/16 2119 07/31/16 0511  BP: 138/70 (!) 124/59  Pulse: 77 71  Resp: 18 18  Temp: 98.4 F (36.9 C) 99.2 F (37.3 C)   Filed Weights   07/25/16 0338  Weight: 148 lb (67.1 kg)    GENERAL:alert, no distress and comfortable SKIN: skin color, texture, turgor are normal, no rashes or significant lesions EYES: normal, conjunctiva are pink and non-injected, sclera clear OROPHARYNX:no exudate, no erythema and lips, buccal mucosa, and tongue normal  NECK: supple, thyroid normal size, non-tender, without nodularity LYMPH:  no palpable lymphadenopathy in the cervical, axillary or inguinal LUNGS: clear to auscultation and percussion with normal breathing effort HEART:  regular rate & rhythm and no murmurs and no lower extremity edema ABDOMEN:Recent laparotomy incisions Musculoskeletal:no cyanosis of digits and no clubbing  PSYCH: alert & oriented x 3 with fluent speech NEURO: no focal motor/sensory deficits   LABORATORY DATA:  I have reviewed the data as listed Lab Results  Component Value Date   WBC 12.7 (H) 07/29/2016   HGB 13.5 07/29/2016   HCT 41.7 07/29/2016   MCV 92.3 07/29/2016   PLT 379 07/29/2016   Lab Results  Component Value Date   NA 140 07/30/2016   K 3.3 (L) 07/30/2016   CL 109 07/30/2016   CO2 26 07/30/2016    RADIOGRAPHIC STUDIES: I have personally reviewed the radiological reports and agreed with the findings in the report.  ASSESSMENT AND PLAN:  1. Well-differentiated neuroendocrine tumor, carcinoid with  1/3 positive lymph node and serosal invasion. I discussed with the patient extensively the meaning of port carcinoid and neuroendocrine carcinoma. I discussed with her that surgery is the primary treatment for these tumors. Given the fact that the margins are clear and the CT scan does not show any other evidence of distant disease, no further surgery is necessary. No indication for systemic chemotherapy or systemic neuroendocrine tumor therapies like Sandostatin LAR.  Patient tells me that her daughter lives in [place] and works in Producer, television/film/video.  Recommendation: 1. CT of the abdomen and pelvis in 6 months along with blood work 2 follow-up after CT scan in 6 months in my office. I will call her on Tuesday to give her the appointment.  Patient could be discharged from medical oncology standpoint. No further workup or treatment is necessary at this time.  All questions were answered. The patient knows to call the clinic with any problems, questions or concerns.    Rulon Eisenmenger, MD @T @

## 2016-07-31 NOTE — Progress Notes (Signed)
PROGRESS NOTE  Anne Stark  [id number] DOB: 1951-09-21 DOA: 07/25/2016 PCP: Anne Naas, MD  Brief Narrative:   Anne Stark is a 65 y.o. female with medical history significant of hypertension, hyperlipidemia, hypothyroidism, who presents with nausea, vomiting,  abdominal pain.  CT demonstrates SBO.  General surgery consulted.   Biopsies reveal carcinoid tumor.  Oncologist consulted 9/1.   Assessment & Plan:   Principal Problem:   SBO (small bowel obstruction) (HCC) Active Problems:   Hypothyroidism   HLD (hyperlipidemia)   Essential hypertension   Abdominal pain   Nausea & vomiting  SBO (small bowel obstruction), with mass;  nausea, vomiting, and loose stools:  CT abdomen/pelvis showed SBO due to a small bowel mass Underwent open small bowel resection on 07/27/2016. - NGT likely to be discontinued 9/1 per surgery.  - biopsy reveals carcinoid tumor :  Small intestine, resection for tumor, Mid Ileum - WELL DIFFERENTIATED NEUROENDOCRINE TUMOR (CARCINOID), SPANNING 1.5 CM. - TUMOR PENETRATES SEROSA. - RESECTION MARGINS ARE NEGATIVE. - METASTATIC TUMOR IN ONE OF THREE LYMPH NODES (1/3).  Oncology was consulted Dr. Lindi Stark who recommended ongoing surveillance with CT every 6 months, serology testing, will follow up with him in office. Appreciate his assistance.    Hypothyroidism: TSH 0.621 on 07/25/2016 -  IV levothyroxine while NPO, now would resume oral meds as she is taking p.o.   HLD: hold statin while NPO, resuming oral meds now that resuming p.o.   Hypertension -  IV hydralazine while NPO, resuming oral meds 9/2.   Hypokalemia -Repleted IV, check magnesium, recheck in AM  DVT ppx: SCD Code Status: Full code Family Communication: bedside Disposition Plan:  Anticipate discharge back to previous home environment  Consultants:   General surgery  Oncology Anne Stark)  Procedures:  none  Antimicrobials:   perioperative  Subjective: Pt reports that  she passing flatus.  Tolerated clears.  No n/v/d.     Objective: Vitals:   07/30/16 1411 07/30/16 1835 07/30/16 2119 07/31/16 0511  BP: (!) 173/82 (!) 148/69 138/70 (!) 124/59  Pulse: 90 87 77 71  Resp:   18 18  Temp: 98.2 F (36.8 C)  98.4 F (36.9 C) 99.2 F (37.3 C)  TempSrc: Oral  Oral Oral  SpO2: 96%  96% 97%  Weight:      Height:        Intake/Output Summary (Last 24 hours) at 07/31/16 1101 Last data filed at 07/31/16 1009  Gross per 24 hour  Intake             1415 ml  Output             1000 ml  Net              415 ml   Filed Weights   07/25/16 0338  Weight: 67.1 kg (148 lb)    Examination:  General exam:  Adult female.  No acute distress.  HEENT:  NCAT, MMM, NGT in place Respiratory system: Clear to auscultation bilaterally Cardiovascular system: Regular rate and rhythm, normal S1/S2. No murmurs, rubs, gallops or clicks.  Warm extremities Gastrointestinal system:  hypoactive BS.  Bandages c/d/i.  Mild guarding due to pain.   MSK:  Normal tone and bulk, no lower extremity edema Neuro:  Grossly intact  Data Reviewed: I have personally reviewed following labs and imaging studies  CBC:  Recent Labs Lab 07/25/16 0337 07/25/16 0348 07/26/16 0526 07/29/16 0756  WBC 11.6*  --  5.8 12.7*  NEUTROABS 10.2*  --   --   --  HGB 14.7 15.6* 11.7* 13.5  HCT 46.1* 46.0 36.7 41.7  MCV 93.3  --  94.6 92.3  PLT 406*  --  301 [id number]   Basic Metabolic Panel:  Recent Labs Lab 07/25/16 0337 07/25/16 0348 07/26/16 0526 07/29/16 0756 07/30/16 0625 07/30/16 1023  NA 141 142 139 139 140  --   K 3.8 3.7 3.7 3.7 3.3*  --   CL 106 104 111 108 109  --   CO2 28  --  23 25 26   --   GLUCOSE 117* 116* 108* 111* 117*  --   BUN 15 17 7  5* 7  --   CREATININE 0.88 0.80 0.63 0.69 0.59  --   CALCIUM 9.3  --  7.7* 8.7* 8.2*  --   MG  --   --   --   --   --  1.8   GFR: Estimated Creatinine Clearance: 59.1 mL/min (by C-G formula based on SCr of 0.8 mg/dL). Liver Function  Tests:  Recent Labs Lab 07/25/16 0337 07/29/16 0756  AST 26 27  ALT 20 20  ALKPHOS 56 52  BILITOT 0.5 0.6  PROT 6.9 6.4*  ALBUMIN 3.8 3.2*    Recent Labs Lab 07/25/16 0337  LIPASE 26   No results for input(s): AMMONIA in the last 168 hours. Coagulation Profile:  Recent Labs Lab 07/25/16 0634  INR 1.00   Cardiac Enzymes: No results for input(s): CKTOTAL, CKMB, CKMBINDEX, TROPONINI in the last 168 hours. BNP (last 3 results) No results for input(s): PROBNP in the last 8760 hours. HbA1C: No results for input(s): HGBA1C in the last 72 hours. CBG:  Recent Labs Lab 07/27/16 0542 07/28/16 0830 07/29/16 0956 07/30/16 0752 07/31/16 0809  GLUCAP 98 102* 90 92 103*   Lipid Profile: No results for input(s): CHOL, HDL, LDLCALC, TRIG, CHOLHDL, LDLDIRECT in the last 72 hours. Thyroid Function Tests: No results for input(s): TSH, T4TOTAL, FREET4, T3FREE, THYROIDAB in the last 72 hours. Anemia Panel: No results for input(s): VITAMINB12, FOLATE, FERRITIN, TIBC, IRON, RETICCTPCT in the last 72 hours. Urine analysis:    Component Value Date/Time   COLORURINE YELLOW 07/25/2016 [place] 07/25/2016 0632   LABSPEC 1.044 (H) 07/25/2016 0632   PHURINE 8.5 (H) 07/25/2016 0632   GLUCOSEU NEGATIVE 07/25/2016 0632   HGBUR NEGATIVE 07/25/2016 0632   BILIRUBINUR NEGATIVE 07/25/2016 0632   KETONESUR NEGATIVE 07/25/2016 0632   PROTEINUR NEGATIVE 07/25/2016 0632   NITRITE NEGATIVE 07/25/2016 0632   LEUKOCYTESUR SMALL (A) 07/25/2016 [id number]   ) Recent Results (from the past 240 hour(s))  Surgical pcr screen     Status: Abnormal   Collection Time: 07/26/16 11:59 PM  Result Value Ref Range Status   MRSA, PCR NEGATIVE NEGATIVE Final   Staphylococcus aureus POSITIVE (A) NEGATIVE Final    Comment:        The Xpert SA Assay (FDA approved for NASAL specimens in patients over 40 years of age), is one component of a comprehensive surveillance program.  Test performance  has been validated by [facility] for patients greater than or equal to 24 year old. It is not intended to diagnose infection nor to guide or monitor treatment.     Radiology Studies: No results found.   Scheduled Meds: . Chlorhexidine Gluconate Cloth  6 each Topical Daily  . enoxaparin (LOVENOX) injection  40 mg Subcutaneous Q24H  . levothyroxine  25 mcg Intravenous Daily  . mouth rinse  15 mL Mouth Rinse BID  . mupirocin  ointment  1 application Nasal BID   Continuous Infusions: . dextrose 5 % and 0.9% NaCl 1,000 mL (07/31/16 0411)    LOS: 6 days   Time spent: 22 min  Irwin Brakeman, MD [facility] Pager [phone]  If 7PM-7AM, please contact night-coverage [email or web address] Password [email or web address] 07/31/2016, 11:01 AM

## 2016-07-31 NOTE — Progress Notes (Signed)
Patient ID: Anne Stark, female   DOB: 12/24/1950, 65 y.o.   MRN: [id number]  [facility] Surgery Progress Note  4 Days Post-Op  Subjective: Feeling well, denies any current pain. Glad to have NG tube out. Tolerated clear liquid diet last night and this morning. +flatus no BM. Denies n/v.  Objective: Vital signs in last 24 hours: Temp:  [98.2 F (36.8 C)-99.2 F (37.3 C)] 99.2 F (37.3 C) (09/02 0511) Pulse Rate:  [71-90] 71 (09/02 0511) Resp:  [18] 18 (09/02 0511) BP: (124-173)/(59-82) 124/59 (09/02 0511) SpO2:  [96 %-97 %] 97 % (09/02 0511) Last BM Date: 07/24/16  Intake/Output from previous day: 09/01 0701 - 09/02 0700 In: 1175 [P.O.:275; I.V.:500; IV Piggyback:400] Out: 1275 [Urine:1100; Emesis/NG output:175] Intake/Output this shift: Total I/O In: 240 [P.O.:240] Out: 400 [Urine:400]  PE: Gen:  Alert, NAD, pleasant Abd: Soft, appropriately tender, ND, +BS, incisions covered with clean/dry dressings  Lab Results:   Recent Labs  07/29/16 0756  WBC 12.7*  HGB 13.5  HCT 41.7  PLT 379   BMET  Recent Labs  07/29/16 0756 07/30/16 0625  NA 139 140  K 3.7 3.3*  CL 108 109  CO2 25 26  GLUCOSE 111* 117*  BUN 5* 7  CREATININE 0.69 0.59  CALCIUM 8.7* 8.2*   PT/INR No results for input(s): LABPROT, INR in the last 72 hours. CMP     Component Value Date/Time   NA 140 07/30/2016 0625   K 3.3 (L) 07/30/2016 0625   CL 109 07/30/2016 0625   CO2 26 07/30/2016 0625   GLUCOSE 117 (H) 07/30/2016 0625   BUN 7 07/30/2016 0625   CREATININE 0.59 07/30/2016 0625   CALCIUM 8.2 (L) 07/30/2016 0625   PROT 6.4 (L) 07/29/2016 0756   ALBUMIN 3.2 (L) 07/29/2016 0756   AST 27 07/29/2016 0756   ALT 20 07/29/2016 0756   ALKPHOS 52 07/29/2016 0756   BILITOT 0.6 07/29/2016 0756   GFRNONAA >60 07/30/2016 0625   GFRAA >60 07/30/2016 0625   Lipase     Component Value Date/Time   LIPASE 26 07/25/2016 0337       Studies/Results: No results  found.  Anti-infectives: Anti-infectives    Start     Dose/Rate Route Frequency Ordered Stop   07/27/16 2100  cefoTEtan (CEFOTAN) 2 g in dextrose 5 % 50 mL IVPB     2 g 100 mL/hr over 30 Minutes Intravenous  Once 07/27/16 1147 07/27/16 2255   07/27/16 1000  cefoTEtan (CEFOTAN) 2 g in dextrose 5 % 50 mL IVPB  Status:  Discontinued     2 g 100 mL/hr over 30 Minutes Intravenous Every 12 hours 07/27/16 0800 07/27/16 1146       Assessment/Plan Small Bowel Obstruction - mass/stricure on CT POD#4 Diagnostic laparoscopy, small bowel resection with anastomosis, Dr. Rosendo Gros (07/27/16) - surgical path: well differentiated neuroendocrine tumor (carcinoid) with 1/3 nodes positive  - saw oncology this morning who recommended: 1. CT of the abdomen and pelvis in 6 months along with blood work 2. follow-up after CT scan in 6 months in my office. I will call her on Tuesday to give her the appointment.  FEN: advance to full liquid diet DVT Proph: lovenox, SCD's ID: [email or web address] once periop  Dispo: advance to full liquid diet. Continue ambulating. Seen by oncology and OP follow-up will be scheduled.    LOS: 6 days    Jerrye Beavers , [facility] Surgery 07/31/2016, 10:43 AM Pager: [phone] Consults: [phone] Mon-Fri  7:00 am-4:30 pm Sat-Sun 7:00 am-11:30 am

## 2016-08-01 DIAGNOSIS — E876 Hypokalemia: Secondary | ICD-10-CM

## 2016-08-01 LAB — BASIC METABOLIC PANEL
ANION GAP: 6 (ref 5–15)
BUN: <5 mg/dL — ABNORMAL LOW (ref 6–20)
CALCIUM: 8.6 mg/dL — AB (ref 8.9–10.3)
CO2: 27 mmol/L (ref 22–32)
CREATININE: 0.66 mg/dL (ref 0.44–1.00)
Chloride: 106 mmol/L (ref 101–111)
GLUCOSE: 84 mg/dL (ref 65–99)
Potassium: 3.4 mmol/L — ABNORMAL LOW (ref 3.5–5.1)
Sodium: 139 mmol/L (ref 135–145)

## 2016-08-01 MED ORDER — POTASSIUM CHLORIDE CRYS ER 20 MEQ PO TBCR
30.0000 meq | EXTENDED_RELEASE_TABLET | Freq: Two times a day (BID) | ORAL | Status: DC
  Administered 2016-08-01 – 2016-08-02 (×2): 30 meq via ORAL
  Filled 2016-08-01 (×2): qty 1

## 2016-08-01 NOTE — Progress Notes (Signed)
PROGRESS NOTE  Anne Stark  [id number] DOB: [birth year] DOA: 07/25/2016 PCP: Reginia Naas, MD  Brief Narrative:   Anne Stark is a 65 y.o. female with medical history significant of hypertension, hyperlipidemia, hypothyroidism, who presents with nausea, vomiting,  abdominal pain.  CT demonstrates SBO.  General surgery consulted.   Biopsies reveal carcinoid tumor.  Oncologist consulted 9/1.   Assessment & Plan:   Principal Problem:   SBO (small bowel obstruction) (HCC) Active Problems:   Hypothyroidism   HLD (hyperlipidemia)   Essential hypertension   Abdominal pain   Nausea & vomiting  SBO (small bowel obstruction), with mass;  nausea, vomiting, and loose stools:  CT abdomen/pelvis showed SBO due to a small bowel mass Underwent open small bowel resection on 07/27/2016. - NGT likely to be discontinued 9/1 per surgery.  - biopsy reveals carcinoid tumor :  Small intestine, resection for tumor, Mid Ileum - WELL DIFFERENTIATED NEUROENDOCRINE TUMOR (CARCINOID), SPANNING 1.5 CM. - TUMOR PENETRATES SEROSA. - RESECTION MARGINS ARE NEGATIVE. - METASTATIC TUMOR IN ONE OF THREE LYMPH NODES (1/3).  Oncology was consulted Dr. Lindi Adie who recommended ongoing surveillance with CT every 6 months, serology testing, will follow up with him in office. Appreciate his assistance.    Hypothyroidism: TSH 0.621 on 07/25/2016 -  IV levothyroxine while NPO, now would resume oral meds as she is taking p.o.   HLD: hold statin while NPO, resuming oral meds now that resuming p.o.   Hypertension -  IV hydralazine while NPO, resuming oral meds 9/2.   Hypokalemia -Repleting orally  DVT ppx: SCD Code Status: Full code Family Communication: bedside Disposition Plan:  Anticipate discharge back to previous home environment possibly 9/4  Consultants:   General surgery  Oncology Lindi Adie)  Procedures:  none  Antimicrobials:   perioperative  Subjective: Pt reports that she passing  flatus and liquid stool noted.  Diet being advanced by surgery.     Objective: Vitals:   07/31/16 0511 07/31/16 1419 07/31/16 2117 08/01/16 0526  BP: (!) 124/59 (!) 154/81 (!) 145/78 130/66  Pulse: 71 80 67 68  Resp: 18 17 18 18   Temp: 99.2 F (37.3 C) 98.7 F (37.1 C) 98.2 F (36.8 C) 98.1 F (36.7 C)  TempSrc: Oral  Oral Oral  SpO2: 97% 99% 96% 97%  Weight:      Height:        Intake/Output Summary (Last 24 hours) at 08/01/16 1213 Last data filed at 08/01/16 0920  Gross per 24 hour  Intake              660 ml  Output              700 ml  Net              -40 ml   Filed Weights   07/25/16 0338  Weight: 67.1 kg (148 lb)    Examination:  General exam:  Adult female.  No acute distress.  HEENT:  NCAT, MMM, NGT in place Respiratory system: Clear to auscultation bilaterally Cardiovascular system: Regular rate and rhythm, normal S1/S2. No murmurs, rubs, gallops or clicks.  Warm extremities Gastrointestinal system:  hypoactive BS.  Bandages c/d/i.  Mild guarding due to pain.   MSK:  Normal tone and bulk, no lower extremity edema Neuro:  Grossly intact  Data Reviewed: I have personally reviewed following labs and imaging studies  CBC:  Recent Labs Lab 07/26/16 0526 07/29/16 0756  WBC 5.8 12.7*  HGB 11.7* 13.5  HCT 36.7  41.7  MCV 94.6 92.3  PLT 301 [id number]   Basic Metabolic Panel:  Recent Labs Lab 07/26/16 0526 07/29/16 0756 07/30/16 0625 07/30/16 1023 08/01/16 0446  NA 139 139 140  --  139  K 3.7 3.7 3.3*  --  3.4*  CL 111 108 109  --  106  CO2 23 25 26   --  27  GLUCOSE 108* 111* 117*  --  84  BUN 7 5* 7  --  <5*  CREATININE 0.63 0.69 0.59  --  0.66  CALCIUM 7.7* 8.7* 8.2*  --  8.6*  MG  --   --   --  1.8  --    GFR: Estimated Creatinine Clearance: 59.1 mL/min (by C-G formula based on SCr of 0.8 mg/dL). Liver Function Tests:  Recent Labs Lab 07/29/16 0756  AST 27  ALT 20  ALKPHOS 52  BILITOT 0.6  PROT 6.4*  ALBUMIN 3.2*   No results for  input(s): LIPASE, AMYLASE in the last 168 hours. No results for input(s): AMMONIA in the last 168 hours. Coagulation Profile: No results for input(s): INR, PROTIME in the last 168 hours. Cardiac Enzymes: No results for input(s): CKTOTAL, CKMB, CKMBINDEX, TROPONINI in the last 168 hours. BNP (last 3 results) No results for input(s): PROBNP in the last 8760 hours. HbA1C: No results for input(s): HGBA1C in the last 72 hours. CBG:  Recent Labs Lab 07/27/16 0542 07/28/16 0830 07/29/16 0956 07/30/16 0752 07/31/16 0809  GLUCAP 98 102* 90 92 103*   Lipid Profile: No results for input(s): CHOL, HDL, LDLCALC, TRIG, CHOLHDL, LDLDIRECT in the last 72 hours. Thyroid Function Tests: No results for input(s): TSH, T4TOTAL, FREET4, T3FREE, THYROIDAB in the last 72 hours. Anemia Panel: No results for input(s): VITAMINB12, FOLATE, FERRITIN, TIBC, IRON, RETICCTPCT in the last 72 hours. Urine analysis:    Component Value Date/Time   COLORURINE YELLOW 07/25/2016 [place] 07/25/2016 0632   LABSPEC 1.044 (H) 07/25/2016 0632   PHURINE 8.5 (H) 07/25/2016 0632   GLUCOSEU NEGATIVE 07/25/2016 0632   HGBUR NEGATIVE 07/25/2016 0632   BILIRUBINUR NEGATIVE 07/25/2016 0632   KETONESUR NEGATIVE 07/25/2016 0632   PROTEINUR NEGATIVE 07/25/2016 0632   NITRITE NEGATIVE 07/25/2016 0632   LEUKOCYTESUR SMALL (A) 07/25/2016 [id number]   ) Recent Results (from the past 240 hour(s))  Surgical pcr screen     Status: Abnormal   Collection Time: 07/26/16 11:59 PM  Result Value Ref Range Status   MRSA, PCR NEGATIVE NEGATIVE Final   Staphylococcus aureus POSITIVE (A) NEGATIVE Final    Comment:        The Xpert SA Assay (FDA approved for NASAL specimens in patients over 65 years of age), is one component of a comprehensive surveillance program.  Test performance has been validated by [facility] for patients greater than or equal to 89 year old. It is not intended to diagnose infection nor  to guide or monitor treatment.     Radiology Studies: No results found.   Scheduled Meds: . aspirin EC  81 mg Oral Daily  . atorvastatin  10 mg Oral Daily  . enoxaparin (LOVENOX) injection  40 mg Subcutaneous Q24H  . furosemide  20 mg Oral Daily  . levothyroxine  50 mcg Oral QAC breakfast  . loratadine  10 mg Oral Daily  . mouth rinse  15 mL Mouth Rinse BID  . multivitamin with minerals  1 tablet Oral Daily  . potassium chloride  30 mEq Oral BID   Continuous  Infusions:    LOS: 7 days   Time spent: 24 min  Irwin Brakeman, MD [facility] Pager [phone]  If 7PM-7AM, please contact night-coverage [email or web address] Password [email or web address] 08/01/2016, 12:13 PM

## 2016-08-01 NOTE — Progress Notes (Signed)
5 Days Post-Op  Subjective: Alert and stable Passing lots of flatus and some liquid stool Tolerating full liquids Minimal pain  Objective: Vital signs in last 24 hours: Temp:  [98.1 F (36.7 C)-98.7 F (37.1 C)] 98.1 F (36.7 C) (09/03 0526) Pulse Rate:  [67-80] 68 (09/03 0526) Resp:  [17-18] 18 (09/03 0526) BP: (130-154)/(66-81) 130/66 (09/03 0526) SpO2:  [96 %-99 %] 97 % (09/03 0526) Last BM Date: 07/24/16  Intake/Output from previous day: 09/02 0701 - 09/03 0700 In: 600 [P.O.:600] Out: 1100 [Urine:1100] Intake/Output this shift: No intake/output data recorded.  General appearance: Alert.  Pleasant.  Mental status normal.  Cooperative. Resp: clear to auscultation bilaterally GI: Soft.  Active bowel sounds.  Wounds clean.  Minimal tenderness.  Lab Results:  Results for orders placed or performed during the hospital encounter of 07/25/16 (from the past 24 hour(s))  Glucose, capillary     Status: Abnormal   Collection Time: 07/31/16  8:09 AM  Result Value Ref Range   Glucose-Capillary 103 (H) 65 - 99 mg/dL     Studies/Results: No results found.  Marland Kitchen aspirin EC  81 mg Oral Daily  . atorvastatin  10 mg Oral Daily  . Chlorhexidine Gluconate Cloth  6 each Topical Daily  . enoxaparin (LOVENOX) injection  40 mg Subcutaneous Q24H  . furosemide  20 mg Oral Daily  . levothyroxine  50 mcg Oral QAC breakfast  . loratadine  10 mg Oral Daily  . mouth rinse  15 mL Mouth Rinse BID  . multivitamin with minerals  1 tablet Oral Daily  . potassium chloride  10 mEq Oral Daily     Assessment/Plan: s/p Procedure(s): LAPAROSCOPY DIAGNOSTIC EXPLORATORY LAPAROTOMY SMALL BOWEL RESECTION  Small Bowel Obstruction - mass/stricure on CT  POD#4 Diagnostic laparoscopy, small bowel resection with anastomosis, Dr. Rosendo Gros (07/27/16) - surgical path: well differentiated neuroendocrine tumor (carcinoid) with 1/3 nodes positive (T4,N1) - Seen by Dr. Lindi Adie of medical oncology yesterday who  recommended: 1. CT of the abdomen and pelvis in 6 months along with blood work 2. follow-up after CT scan in 6 months in my office. I will call her on Tuesday to give her the appointment.  FEN: Advance to soft diet. DVT Proph: lovenox, SCD's ID: [email or web address] once periop  Dispo: Advance to soft diet today.  Probable discharge tomorrow.              Arrange follow-up with Dr. Ralene Ok of [facility] surgery in 2 weeks              CT scan of abdomen and pelvis in 6 months with contrast              Follow-up with Dr. Lindi Adie at [facility] in 6 months after CT scan.  Dr. Lindi Adie states he will call her on                                Tuesday to give the appointment.   @PROBHOSP @  LOS: 7 days    Anne Stark 08/01/2016  . .prob

## 2016-08-02 ENCOUNTER — Encounter (HOSPITAL_COMMUNITY): Payer: Self-pay | Admitting: General Surgery

## 2016-08-02 DIAGNOSIS — D3A019 Benign carcinoid tumor of the small intestine, unspecified portion: Secondary | ICD-10-CM

## 2016-08-02 HISTORY — DX: Benign carcinoid tumor of the small intestine, unspecified portion: D3A.019

## 2016-08-02 MED ORDER — OXYCODONE-ACETAMINOPHEN 5-325 MG PO TABS: 1.0000 | ORAL_TABLET | ORAL | 0 refills | Status: DC | PRN

## 2016-08-02 NOTE — Progress Notes (Signed)
6 Days Post-Op  Subjective:  Doing well.  Tolerating regular diet.  No cramps or nausea.  Passing lots of flatus and some liquid stool.  I told her this was relatively normal at this stage of recovery.  Anticipate she will transition to solid stools over the next 2 or 3 weeks.  She wants to go home and I think she is ready  Objective: Vital signs in last 24 hours: Temp:  [98.1 F (36.7 C)-99.9 F (37.7 C)] 98.1 F (36.7 C) (09/03 2206) Pulse Rate:  [75-81] 75 (09/03 2206) Resp:  [18] 18 (09/03 2206) BP: (136-137)/(63-74) 137/74 (09/03 2206) SpO2:  [94 %-98 %] 94 % (09/03 2206) Last BM Date: 08/01/16  Intake/Output from previous day: 09/03 0701 - 09/04 0700 In: 840 [P.O.:840] Out: -  Intake/Output this shift: Total I/O In: 240 [P.O.:240] Out: -    EXAM:  General appearance: Alert.  Pleasant.  Mental status normal.  Cooperative. Resp: clear to auscultation bilaterally GI: Soft.  Active bowel sounds.  Wounds clean.  Minimal tenderness.  Not distended.  Not tympanitic.  Benign exam.   Lab Results:  No results found for this or any previous visit (from the past 24 hour(s)).   Studies/Results: No results found.  Marland Kitchen aspirin EC  81 mg Oral Daily  . atorvastatin  10 mg Oral Daily  . enoxaparin (LOVENOX) injection  40 mg Subcutaneous Q24H  . furosemide  20 mg Oral Daily  . levothyroxine  50 mcg Oral QAC breakfast  . loratadine  10 mg Oral Daily  . mouth rinse  15 mL Mouth Rinse BID  . multivitamin with minerals  1 tablet Oral Daily  . potassium chloride  30 mEq Oral BID     Assessment/Plan: s/p Procedure(s): LAPAROSCOPY DIAGNOSTIC EXPLORATORY LAPAROTOMY SMALL BOWEL RESECTION  Small Bowel Obstruction - mass/stricure on CT  POD#4Diagnostic laparoscopy, small bowel resection with anastomosis, Dr. Rosendo Gros (07/27/16) - surgical path: well differentiated neuroendocrine tumor (carcinoid) with 1/3 nodes positive (T4,N1) - Seen by Dr. Lindi Adie of medical oncology  yesterday who recommended: 1. CT of the abdomen and pelvis in 6 months along with blood work 2.follow-up after CT scan in 6 months in my office. I will call her on Tuesday to give her the appointment.  FEN: Advance to soft diet. DVT Proph: lovenox, SCD's ID: [email or web address] once periop  Dispo:   Recommend discharge today..              Arrange follow-up with Dr. Ralene Ok of [facility] surgery in 2 weeks              CT scan of abdomen and pelvis in 6 months with contrast              Follow-up with Dr. Lindi Adie at [place] in 6 months after CT scan.  Dr. Lindi Adie states he will call her on                                Tuesday to give the appointment.   @PROBHOSP @  LOS: 8 days    Jera Headings M 08/02/2016  . .prob

## 2016-08-02 NOTE — Discharge Summary (Signed)
Physician Discharge Summary  Kinyata Hosek [id number] DOB: Jul 16, 1951 DOA: 07/25/2016  PCP: Reginia Naas, MD  Admit date: 07/25/2016 Discharge date: 08/02/2016  Admitted From: Home  Disposition: Home  Recommendations for Outpatient Follow-up:  1. Follow up with PCP in 1 weeks  2. Follow up with Dr. Lindi Adie in 6 months and get CT scan in 6 months 3. Follow up with general surgeon in 2 weeks.  Home Health: [facility]  Discharge Condition: stable  CODE STATUS: FULL  Brief/Interim Summary: Brief Narrative:   Anne Hobsonis a 65 y.o.femalwith medical history significant of hypertension, hyperlipidemia, hypothyroidism, who presents with nausea, vomiting, abdominal pain.  CT demonstrates SBO.  General surgery consulted.   Biopsies reveal carcinoid tumor.  Oncologist consulted 9/1.   Assessment & Plan:   Principal Problem:   SBO (small bowel obstruction) (HCC) Active Problems:   Hypothyroidism   HLD (hyperlipidemia)   Essential hypertension   Abdominal pain   Nausea & vomiting  SBO (small bowel obstruction), with mass;  nausea, vomiting, and loose stools: CT abdomen/pelvis showed SBO due to a small bowel mass Underwent open small bowel resection on 07/27/2016. - NGT likely to be discontinued 9/1 per surgery.  - biopsy reveals carcinoid tumor :  Small intestine, resection for tumor, Mid Ileum - WELL DIFFERENTIATED NEUROENDOCRINE TUMOR (CARCINOID), SPANNING 1.5 CM. - TUMOR PENETRATES SEROSA. - RESECTION MARGINS ARE NEGATIVE. - METASTATIC TUMOR IN ONE OF THREE LYMPH NODES (1/3).  Oncology was consulted Dr. Lindi Adie who recommended ongoing surveillance with CT every 6 months, serology testing, will follow up with him in office. Appreciate his assistance. He plans to have office call patient 9/5 with appointment.   Hypothyroidism: TSH 0.621 on 07/25/2016 -  IV levothyroxine while NPO,  resumed home oral levothyroxine prior to discharge   [id number] statin while NPO,  resuming oral meds now that resuming p.o.    Hypertension -   resumed home oral meds 9/2.   Hypokalemia -Repleted orally  DVT ppx: SCD Code Status:Full code Family Communication: bedside Disposition Plan: Home  Consultants:   General surgery  Oncology Lindi Adie)  Discharge Diagnoses:  Principal Problem:   Carcinoid tumor of small intestine ([place]) Active Problems:   Hypothyroidism   HLD (hyperlipidemia)   Essential hypertension   Abdominal pain   SBO (small bowel obstruction) (HCC)   Nausea & vomiting  Discharge Instructions  Discharge Instructions    Call MD for:  difficulty breathing, headache or visual disturbances    Complete by:  As directed   Call MD for:  hives    Complete by:  As directed   Call MD for:  persistant dizziness or light-headedness    Complete by:  As directed   Call MD for:  persistant nausea and vomiting    Complete by:  As directed   Call MD for:  redness, tenderness, or signs of infection (pain, swelling, redness, odor or green/yellow discharge around incision site)    Complete by:  As directed   Call MD for:  severe uncontrolled pain    Complete by:  As directed   Call MD for:  temperature >100.4    Complete by:  As directed   Diet - low sodium heart healthy    Complete by:  As directed   Discharge instructions    Complete by:  As directed   Drink lots of fluids and stay well hydrated Follow a high-fiber, low-fat diet  Make an appointment to see Dr. Ralene Ok in 2-3 weeks  Make an appointment  to see Dr. Lindi Adie in the cancer center in 6 months after getting a CT scan of your abdomen and pelvis     [facility] LAPAROSCOPIC SURGERY: POST OP INSTRUCTIONS Always review your discharge instruction sheet given to you by the facility where your surgery was performed. IF YOU HAVE DISABILITY OR FAMILY LEAVE FORMS, YOU MUST BRING THEM TO THE OFFICE FOR PROCESSING.   DO NOT GIVE THEM TO YOUR DOCTOR.  A  prescription for pain medication may be given to you upon discharge.  Take your pain medication as prescribed, if needed.  If narcotic pain medicine is not needed, then you may take acetaminophen (Tylenol) or ibuprofen (Advil) as needed. Take your usually prescribed medications unless otherwise directed. If you need a refill on your pain medication, please contact your pharmacy.  They will contact our office to request authorization. Prescriptions will not be filled after 5pm or on week-ends. You should follow a light diet the first few days after arrival home, such as soup and crackers, etc.  Be sure to include lots of fluids daily. Most patients will experience some swelling and bruising in the area of the incisions.  Ice packs will help.  Swelling and bruising can take several days to resolve.  It is common to experience some constipation if taking pain medication after surgery.  Increasing fluid intake and taking a stool softener (such as Colace) will usually help or prevent this problem from occurring.  A mild laxative (Milk of Magnesia or Miralax) should be taken according to package instructions if there are no bowel movements after 48 hours. Unless discharge instructions indicate otherwise, you may remove your bandages 24-48 hours after surgery, and you may shower at that time.  You may have steri-strips (small skin tapes) in place directly over the incision.  These strips should be left on the skin for 7-10 days.  If your surgeon used skin glue on the incision, you may shower in 24 hours.  The glue will flake off over the next 2-3 weeks.  Any sutures or staples will be removed at the office during your follow-up visit. ACTIVITIES:  You may resume regular (light) daily activities beginning the next day-such as daily self-care, walking, climbing stairs-gradually increasing activities as tolerated.  You may have sexual intercourse when it is comfortable.  Refrain from any heavy lifting or straining  until approved by your doctor. You may drive when you are no longer taking prescription pain medication, you can comfortably wear a seatbelt, and you can safely maneuver your car and apply brakes. RETURN TO WORK:  __________________________________________________________ Dennis Bast should see your doctor in the office for a follow-up appointment approximately 2-3 weeks after your surgery.  Make sure that you call for this appointment within a day or two after you arrive home to insure a convenient appointment time. OTHER INSTRUCTIONS: __________________________________________________________________________________________________________________________ __________________________________________________________________________________________________________________________ WHEN TO CALL YOUR DOCTOR: Fever over 101.0 Inability to urinate Continued bleeding from incision. Increased pain, redness, or drainage from the incision. Increasing abdominal pain  The clinic staff is available to answer your questions during regular business hours.  Please don't hesitate to call and ask to speak to one of the nurses for clinical concerns.  If you have a medical emergency, go to the nearest emergency room or call 911.  A surgeon from [facility] is always on call at the hospital. [address] [place], [place], [place]  [address] ? [address], [place], [place]   [address] [phone])  [phone] ? [phone] ? FAX [phone] Web site: [email or web address]   Driving Restrictions    Complete by:  As directed   2-4 days. Do not drive if you are taking narcotic pain medicine Where your seatbelt properly   Increase activity slowly    Complete by:  As directed   Lifting restrictions    Complete by:  As directed   20 lbs   May shower / Bathe    Complete by:  As directed   May walk up steps    Complete by:  As directed       Medication List    TAKE these medications   albuterol 108  (90 Base) MCG/ACT inhaler Commonly known as:  PROVENTIL HFA;VENTOLIN HFA Inhale 2 puffs into the lungs every 6 (six) hours as needed. For shortness of breath or wheeze   alendronate 70 MG tablet Commonly known as:  FOSAMAX Take 70 mg by mouth every 7 (seven) days. Take with a full glass of water on an empty stomach.  On Sunday   aspirin EC 81 MG tablet Take 81 mg by mouth daily.   atorvastatin 10 MG tablet Commonly known as:  LIPITOR Take 10 mg by mouth daily.   azelastine 0.1 % nasal spray Commonly known as:  ASTELIN Place 1 spray into the nose 2 (two) times daily as needed for rhinitis or allergies. Use in each nostril as directed   furosemide 20 MG tablet Commonly known as:  LASIX Take 20 mg by mouth daily.   Glucosamine Sulfate 1000 MG Caps Take 1,000 mg by mouth daily.   levothyroxine 50 MCG tablet Commonly known as:  SYNTHROID, LEVOTHROID Take 50 mcg by mouth daily before breakfast.   loratadine 10 MG tablet Commonly known as:  CLARITIN Take 10 mg by mouth daily.   multivitamin with minerals Tabs tablet Take 1 tablet by mouth daily.   oxyCODONE-acetaminophen 5-325 MG tablet Commonly known as:  PERCOCET/ROXICET Take 1-2 tablets by mouth every 4 (four) hours as needed for moderate pain.   potassium chloride 10 MEQ tablet Commonly known as:  K-DUR,KLOR-CON Take 10 mEq by mouth daily.      Follow-up Information    Rulon Eisenmenger, MD. Schedule an appointment as soon as possible for a visit in 6 month(s).   Specialty:  Hematology and Oncology Contact information: [place] [address] [phone]        Reyes Ivan, MD. Schedule an appointment as soon as possible for a visit in 2 week(s).   Specialty:  General Surgery Contact information: [address] [place] [address] [phone]        Reginia Naas, MD Follow up in 2 week(s).   Specialty:  Family Medicine Why:  Hospital Follow Up  Contact  information: [place] [address] [phone]          No Known Allergies  Procedures/Studies: Ct Abdomen Pelvis W Contrast  Result Date: 07/25/2016 CLINICAL DATA:  Sudden onset of mid abdominal pain radiating to the back. EXAM: CT ABDOMEN AND PELVIS WITH CONTRAST TECHNIQUE: Multidetector CT imaging of the abdomen and pelvis was performed using the standard protocol following bolus administration of intravenous contrast. CONTRAST:  100 cc Isovue-300 IV COMPARISON:  CT 08/28/2009 FINDINGS: Lower chest: Dependent ground-glass opacities in the lung bases consistent with atelectasis. No pleural fluid. Liver: No focal lesion. Trace subdiaphragmatic perihepatic fluid is unchanged from prior exam. Hepatobiliary: Gallbladder physiologically distended, no  calcified stone. No biliary dilatation. Pancreas: No ductal dilatation or inflammation. Spleen: Normal. Adrenal glands: No nodule. Kidneys: Symmetric renal enhancement and excretion. No hydronephrosis. No perinephric edema. Stomach/Bowel: Small hiatal hernia. Proximal small bowel loops are nondilated. More distal pelvic small bowel loops are dilated and fluid-filled with fecalization of small bowel contents and abrupt transition point in the left mid abdomen best appreciated on coronal reformats image 28 series 4. There is a questionable enhancing focus at the site of transition. There is mesenteric edema small volume of free fluid. No pneumatosis or free air. Majority of the colon is decompressed, small volume of colonic stool in the ascending colon. Appendix tentatively identified and normal. Vascular/Lymphatic: No retroperitoneal adenopathy. Abdominal aorta is normal in caliber. Mild for age atherosclerosis without aneurysm. Reproductive: The uterus appears normal. No adnexal mass. The ovaries are not well-defined. Prominent left adnexal vascularity and dilated left ovarian veins, similar to prior exam. Bladder:  Physiologically distended without wall thickening. Other: No intra-abdominal abscess or free air. Free fluid in the lower abdomen and pelvis. Tiny fat containing umbilical hernia. Musculoskeletal: Compression fracture of T12, new from CT 2010 but appears chronic. There is degenerative change in the lumbar spine with primarily facet arthropathy. There are no acute or suspicious osseous abnormalities. IMPRESSION: 1. Small bowel obstruction with transition point in the left mid abdomen. Questionable enhancing focus at the site of obstruction which may be mesenteric vessels, however possibility of small bowel mass or AVM is raised. 2. Similar findings of prominent left adnexal vascularity and dilatation of the ovarian veins, suggesting pelvic congestion syndrome. Electronically Signed   By: Jeb Levering M.D.   On: 07/25/2016 05:13   Dg Abd Portable 1v  Result Date: 07/26/2016 CLINICAL DATA:  Nausea, vomiting. EXAM: PORTABLE ABDOMEN - 1 VIEW COMPARISON:  Radiographs of August 29, 2009. FINDINGS: The bowel gas pattern is normal. Small phlebolith is noted in right side of pelvis. IMPRESSION: No evidence of bowel obstruction or ileus. Electronically Signed   By: Marijo Conception, M.D.   On: 07/26/2016 07:37    Subjective: Pt says she is ready to go home, she has tolerated her diet, she has had flatus and stools Discharge Exam: Vitals:   08/01/16 2206 08/02/16 0558  BP: 137/74 136/71  Pulse: 75 74  Resp: 18 18  Temp: 98.1 F (36.7 C) 98.4 F (36.9 C)   Vitals:   08/01/16 0526 08/01/16 1327 08/01/16 2206 08/02/16 0558  BP: 130/66 136/63 137/74 136/71  Pulse: 68 81 75 74  Resp: 18 18 18 18   Temp: 98.1 F (36.7 C) 99.9 F (37.7 C) 98.1 F (36.7 C) 98.4 F (36.9 C)  TempSrc: Oral Oral Oral Oral  SpO2: 97% 98% 94% 97%  Weight:      Height:       General exam:  Adult female.  No acute distress.  HEENT:  NCAT, MMM  Respiratory system: Clear to auscultation bilaterally Cardiovascular system:  Regular rate and rhythm, normal S1/S2. No murmurs, rubs, gallops or clicks.  Warm extremities Gastrointestinal system:  normal BS.  soft, nondistended.   MSK:  Normal tone and bulk, no lower extremity edema Neuro:  Grossly intact  The results of significant diagnostics from this hospitalization (including imaging, microbiology, ancillary and laboratory) are listed below for reference.    Microbiology: Recent Results (from the past 240 hour(s))  Surgical pcr screen     Status: Abnormal   Collection Time: 07/26/16 11:59 PM  Result Value Ref Range Status  MRSA, PCR NEGATIVE NEGATIVE Final   Staphylococcus aureus POSITIVE (A) NEGATIVE Final    Comment:        The Xpert SA Assay (FDA approved for NASAL specimens in patients over 6 years of age), is one component of a comprehensive surveillance program.  Test performance has been validated by [facility] for patients greater than or equal to 18 year old. It is not intended to diagnose infection nor to guide or monitor treatment.     Labs: BNP (last 3 results) No results for input(s): BNP in the last 8760 hours. Basic Metabolic Panel:  Recent Labs Lab 07/29/16 0756 07/30/16 0625 07/30/16 1023 08/01/16 0446  NA 139 140  --  139  K 3.7 3.3*  --  3.4*  CL 108 109  --  106  CO2 25 26  --  27  GLUCOSE 111* 117*  --  84  BUN 5* 7  --  <5*  CREATININE 0.69 0.59  --  0.66  CALCIUM 8.7* 8.2*  --  8.6*  MG  --   --  1.8  --    Liver Function Tests:  Recent Labs Lab 07/29/16 0756  AST 27  ALT 20  ALKPHOS 52  BILITOT 0.6  PROT 6.4*  ALBUMIN 3.2*   No results for input(s): LIPASE, AMYLASE in the last 168 hours. No results for input(s): AMMONIA in the last 168 hours. CBC:  Recent Labs Lab 07/29/16 0756  WBC 12.7*  HGB 13.5  HCT 41.7  MCV 92.3  PLT 379   Cardiac Enzymes: No results for input(s): CKTOTAL, CKMB, CKMBINDEX, TROPONINI in the last 168 hours. BNP: Invalid input(s): POCBNP CBG:  Recent Labs Lab  07/27/16 0542 07/28/16 0830 07/29/16 0956 07/30/16 0752 07/31/16 0809  GLUCAP 98 102* 90 92 103*   D-Dimer No results for input(s): DDIMER in the last 72 hours. Hgb A1c No results for input(s): HGBA1C in the last 72 hours. Lipid Profile No results for input(s): CHOL, HDL, LDLCALC, TRIG, CHOLHDL, LDLDIRECT in the last 72 hours. Thyroid function studies No results for input(s): TSH, T4TOTAL, T3FREE, THYROIDAB in the last 72 hours.  Invalid input(s): FREET3 Anemia work up No results for input(s): VITAMINB12, FOLATE, FERRITIN, TIBC, IRON, RETICCTPCT in the last 72 hours. Urinalysis    Component Value Date/Time   COLORURINE YELLOW 07/25/2016 0632   APPEARANCEUR CLEAR 07/25/2016 0632   LABSPEC 1.044 (H) 07/25/2016 0632   PHURINE 8.5 (H) 07/25/2016 0632   GLUCOSEU NEGATIVE 07/25/2016 0632   HGBUR NEGATIVE 07/25/2016 0632   BILIRUBINUR NEGATIVE 07/25/2016 0632   KETONESUR NEGATIVE 07/25/2016 0632   PROTEINUR NEGATIVE 07/25/2016 0632   NITRITE NEGATIVE 07/25/2016 0632   LEUKOCYTESUR SMALL (A) 07/25/2016 [id number]   Sepsis Labs Invalid input(s): PROCALCITONIN,  WBC,  LACTICIDVEN Microbiology Recent Results (from the past 240 hour(s))  Surgical pcr screen     Status: Abnormal   Collection Time: 07/26/16 11:59 PM  Result Value Ref Range Status   MRSA, PCR NEGATIVE NEGATIVE Final   Staphylococcus aureus POSITIVE (A) NEGATIVE Final    Comment:        The Xpert SA Assay (FDA approved for NASAL specimens in patients over 18 years of age), is one component of a comprehensive surveillance program.  Test performance has been validated by [facility] for patients greater than or equal to 30 year old. It is not intended to diagnose infection nor to guide or monitor treatment.    Time coordinating discharge: 32 minutes  SIGNED:  [place]  Wynetta Emery, MD  [facility] 08/02/2016, 11:16 AM Pager   If 7PM-7AM, please contact night-coverage [email or web address] Password [email or web address]

## 2016-08-02 NOTE — Progress Notes (Signed)
Discharge instructions RX's and follow up appt explained and provided to patient verbalized understanding. Home health set up for patient, patient left floor via wheel chair accompanied by staff no c/o pain or shortness of breath at d/c.  Onia Shiflett, Tivis Ringer, RN

## 2016-08-02 NOTE — Care Management Note (Addendum)
Case Management Note  Patient Details  Name: Anne Stark MRN: [id number] Date of Birth: 1951/10/17  Subjective/Objective:            Pt with medical history significant of hypertension, hyperlipidemia, hypothyroidism, who presents with nausea, vomiting, abdominal pain. From home with husband.    -  S/P  LAPAROSCOPY DIAGNOSTIC, Small bowel resection with anastomosis  07/27/2016 -  Biopsies reveal carcinoid tumor.  Oncologist consulted 9/1, pt  will f/u with oncologist @ d/c   PCP:  Alben Spittle   Action/Plan: Plan is to d/c to home today with home health services ( PT).  Expected Discharge Date:   08/02/2016           Expected Discharge Plan:  Home/Self Care  In-House Referral:     Discharge planning Services  CM Consult  Post Acute Care Choice:    Choice offered to:   Patient  DME Arranged:    DME Agency:     HH Arranged:  PT [place]:   [facility]/ referral made with [facility] at [phone]  Status of Service:  Completed, signed off  If discussed at [place] of Stay Meetings, dates discussed:    Additional Comments:  Sharin Mons, RN 08/02/2016, 10:27 AM

## 2016-08-02 NOTE — Discharge Instructions (Signed)
-  see above 

## 2016-08-03 ENCOUNTER — Other Ambulatory Visit: Payer: Self-pay

## 2016-08-03 DIAGNOSIS — D3A019 Benign carcinoid tumor of the small intestine, unspecified portion: Secondary | ICD-10-CM

## 2016-08-03 NOTE — Progress Notes (Signed)
Received verbal order from Dr. Lindi Adie to schedule pt to be seen in 6 months for follow up regarding carcinoid tumor as well as to have CBC with diff, CMP, and Chromogranin-A.  Pt also to be scheduled for CT Abdomen Pelvis to be done prior to appt to allow for results to be reviewed at appt. All orders and scheduling message entered.

## 2016-08-04 ENCOUNTER — Telehealth: Payer: Self-pay | Admitting: Hematology and Oncology

## 2016-08-04 NOTE — Telephone Encounter (Signed)
lvm to inform pt of 3/6 appt date/times per LOS

## 2017-01-05 ENCOUNTER — Other Ambulatory Visit: Payer: Self-pay | Admitting: Emergency Medicine

## 2017-01-25 ENCOUNTER — Other Ambulatory Visit (HOSPITAL_BASED_OUTPATIENT_CLINIC_OR_DEPARTMENT_OTHER): Payer: Medicare Other

## 2017-01-25 ENCOUNTER — Ambulatory Visit (HOSPITAL_COMMUNITY)
Admission: RE | Admit: 2017-01-25 | Discharge: 2017-01-25 | Disposition: A | Discharge status: Home / Self Care | Payer: Medicare Other | Source: Ambulatory Visit | Attending: Hematology and Oncology | Admitting: Hematology and Oncology

## 2017-01-25 ENCOUNTER — Encounter (HOSPITAL_COMMUNITY): Payer: Self-pay

## 2017-01-25 DIAGNOSIS — D3A019 Benign carcinoid tumor of the small intestine, unspecified portion: Secondary | ICD-10-CM | POA: Insufficient documentation

## 2017-01-25 LAB — CBC WITH DIFFERENTIAL/PLATELET
BASO%: 0.6 % (ref 0.0–2.0)
Basophils Absolute: 0 10*3/uL (ref 0.0–0.1)
EOS ABS: 0.1 10*3/uL (ref 0.0–0.5)
EOS%: 1.9 % (ref 0.0–7.0)
HCT: 43.8 % (ref 34.8–46.6)
HEMOGLOBIN: 14.6 g/dL (ref 11.6–15.9)
LYMPH%: 34.3 % (ref 14.0–49.7)
MCH: 30.2 pg (ref 25.1–34.0)
MCHC: 33.2 g/dL (ref 31.5–36.0)
MCV: 91.1 fL (ref 79.5–101.0)
MONO#: 0.6 10*3/uL (ref 0.1–0.9)
MONO%: 9.2 % (ref 0.0–14.0)
NEUT%: 54 % (ref 38.4–76.8)
NEUTROS ABS: 3.6 10*3/uL (ref 1.5–6.5)
Platelets: 381 10*3/uL (ref 145–400)
RBC: 4.81 10*6/uL (ref 3.70–5.45)
RDW: 13.3 % (ref 11.2–14.5)
WBC: 6.7 10*3/uL (ref 3.9–10.3)
lymph#: 2.3 10*3/uL (ref 0.9–3.3)

## 2017-01-25 LAB — COMPREHENSIVE METABOLIC PANEL
ALBUMIN: 4 g/dL (ref 3.5–5.0)
ALK PHOS: 64 U/L (ref 40–150)
ALT: 25 U/L (ref 0–55)
AST: 27 U/L (ref 5–34)
Anion Gap: 9 mEq/L (ref 3–11)
BILIRUBIN TOTAL: 0.62 mg/dL (ref 0.20–1.20)
BUN: 16.5 mg/dL (ref 7.0–26.0)
CO2: 29 mEq/L (ref 22–29)
Calcium: 9.7 mg/dL (ref 8.4–10.4)
Chloride: 104 mEq/L (ref 98–109)
Creatinine: 0.9 mg/dL (ref 0.6–1.1)
EGFR: 67 mL/min/{1.73_m2} — AB (ref >90)
GLUCOSE: 97 mg/dL (ref 70–140)
Potassium: 4.2 mEq/L (ref 3.5–5.1)
SODIUM: 142 meq/L (ref 136–145)
TOTAL PROTEIN: 7.5 g/dL (ref 6.4–8.3)

## 2017-01-25 MED ORDER — IOPAMIDOL (ISOVUE-300) INJECTION 61%
INTRAVENOUS | Status: AC
  Administered 2017-01-25: 100 mL
  Filled 2017-01-25: qty 100

## 2017-01-25 MED ORDER — IOPAMIDOL (ISOVUE-300) INJECTION 61%: 30.0000 mL | Freq: Once | INTRAVENOUS | Status: DC | PRN

## 2017-01-25 MED ORDER — IOPAMIDOL (ISOVUE-300) INJECTION 61%
INTRAVENOUS | Status: AC
  Filled 2017-01-25: qty 30

## 2017-02-01 ENCOUNTER — Encounter: Payer: Self-pay | Admitting: Hematology and Oncology

## 2017-02-01 ENCOUNTER — Ambulatory Visit (HOSPITAL_BASED_OUTPATIENT_CLINIC_OR_DEPARTMENT_OTHER): Payer: Medicare Other | Admitting: Hematology and Oncology

## 2017-02-01 ENCOUNTER — Other Ambulatory Visit: Payer: Medicare (Managed Care)

## 2017-02-01 DIAGNOSIS — D3A019 Benign carcinoid tumor of the small intestine, unspecified portion: Secondary | ICD-10-CM

## 2017-02-01 DIAGNOSIS — Z85060 Personal history of malignant carcinoid tumor of small intestine: Secondary | ICD-10-CM | POA: Diagnosis not present

## 2017-02-01 MED ORDER — LISINOPRIL 5 MG PO TABS: 5.0000 mg | ORAL_TABLET | Freq: Every day | ORAL | Status: AC

## 2017-02-01 NOTE — Assessment & Plan Note (Signed)
Well-differentiated neuroendocrine tumor, carcinoid with 1/3 positive lymph node and serosal invasion Dr. Rosendo Gros who performed small bowel resection with anastomosis on 07/27/2016.   CT abdomen pelvis 01/25/2017: No enhancing lesions no bowel obstruction no liver metastases no lymph nodes Blood work: Chromogranin A was obtained and is pending.  Plan: Surveillance Return to clinic in 6 months with labs and follow-up. Plan would be to obtain a CT scan in one year. If that CT scan is normal and we will plan for CT scans in 3 years that point.

## 2017-02-01 NOTE — Progress Notes (Signed)
Patient Care Team: Carol Ada, MD as PCP - General (Family Medicine)  DIAGNOSIS:  Encounter Diagnosis  Name Primary?  . Carcinoid tumor of small intestine     CHIEF COMPLIANT: Six-month follow-up of carcinoid tumor resection  INTERVAL HISTORY: Anne Stark is a 42 year with above-mentioned history of small intestinal carcinoid which was resected months ago and is here for a CT scan and follow-up. She has healed very well and does not have any gastrointestinal symptoms. Denies abdominal pain nausea vomiting diarrhea or constipation. Denies any flushing sensation or diarrhea.  REVIEW OF SYSTEMS:   Constitutional: Denies fevers, chills or abnormal weight loss Eyes: Denies blurriness of vision Ears, nose, mouth, throat, and face: Denies mucositis or sore throat Respiratory: Denies cough, dyspnea or wheezes Cardiovascular: Denies palpitation, chest discomfort Gastrointestinal:  Denies nausea, heartburn or change in bowel habits Skin: Denies abnormal skin rashes Lymphatics: Denies new lymphadenopathy or easy bruising Neurological:Denies numbness, tingling or new weaknesses Behavioral/Psych: Mood is stable, no new changes  Extremities: No lower extremity edema All other systems were reviewed with the patient and are negative.  I have reviewed the past medical history, past surgical history, social history and family history with the patient and they are unchanged from previous note.  ALLERGIES:  has No Known Allergies.  MEDICATIONS:  Current Outpatient Prescriptions  Medication Sig Dispense Refill  . albuterol (PROVENTIL HFA;VENTOLIN HFA) 108 (90 BASE) MCG/ACT inhaler Inhale 2 puffs into the lungs every 6 (six) hours as needed. For shortness of breath or wheeze    . alendronate (FOSAMAX) 70 MG tablet Take 70 mg by mouth every 7 (seven) days. Take with a full glass of water on an empty stomach.  On Sunday    . aspirin EC 81 MG tablet Take 81 mg by mouth daily.    Marland Kitchen atorvastatin  (LIPITOR) 10 MG tablet Take 10 mg by mouth daily.    Marland Kitchen azelastine (ASTELIN) 137 MCG/SPRAY nasal spray Place 1 spray into the nose 2 (two) times daily as needed for rhinitis or allergies. Use in each nostril as directed    . furosemide (LASIX) 20 MG tablet Take 20 mg by mouth daily.    . Glucosamine Sulfate 1000 MG CAPS Take 1,000 mg by mouth daily.    Marland Kitchen levothyroxine (SYNTHROID, LEVOTHROID) 50 MCG tablet Take 50 mcg by mouth daily before breakfast.    . loratadine (CLARITIN) 10 MG tablet Take 10 mg by mouth daily.    . Multiple Vitamin (MULTIVITAMIN WITH MINERALS) TABS Take 1 tablet by mouth daily.    Marland Kitchen oxyCODONE-acetaminophen (PERCOCET/ROXICET) 5-325 MG tablet Take 1-2 tablets by mouth every 4 (four) hours as needed for moderate pain. 30 tablet 0  . potassium chloride (K-DUR,KLOR-CON) 10 MEQ tablet Take 10 mEq by mouth daily.     No current facility-administered medications for this visit.     PHYSICAL EXAMINATION: ECOG PERFORMANCE STATUS: 1 - Symptomatic but completely ambulatory  Vitals:   02/01/17 0901  BP: (!) 154/71  Pulse: 72  Resp: 18  Temp: 97.7 F (36.5 C)   Filed Weights   02/01/17 0901  Weight: 149 lb 12.8 oz (67.9 kg)    GENERAL:alert, no distress and comfortable SKIN: skin color, texture, turgor are normal, no rashes or significant lesions EYES: normal, Conjunctiva are pink and non-injected, sclera clear OROPHARYNX:no exudate, no erythema and lips, buccal mucosa, and tongue normal  NECK: supple, thyroid normal size, non-tender, without nodularity LYMPH:  no palpable lymphadenopathy in the cervical, axillary or inguinal  LUNGS: clear to auscultation and percussion with normal breathing effort HEART: regular rate & rhythm and no murmurs and no lower extremity edema ABDOMEN:abdomen soft, non-tender and normal bowel sounds MUSCULOSKELETAL:no cyanosis of digits and no clubbing  NEURO: alert & oriented x 3 with fluent speech, no focal motor/sensory deficits EXTREMITIES:  No lower extremity edema  LABORATORY DATA:  I have reviewed the data as listed   Chemistry      Component Value Date/Time   NA 142 01/25/2017 0935   K 4.2 01/25/2017 0935   CL 106 08/01/2016 0446   CO2 29 01/25/2017 0935   BUN 16.5 01/25/2017 0935   CREATININE 0.9 01/25/2017 0935      Component Value Date/Time   CALCIUM 9.7 01/25/2017 0935   ALKPHOS 64 01/25/2017 0935   AST 27 01/25/2017 0935   ALT 25 01/25/2017 0935   BILITOT 0.62 01/25/2017 0935       Lab Results  Component Value Date   WBC 6.7 01/25/2017   HGB 14.6 01/25/2017   HCT 43.8 01/25/2017   MCV 91.1 01/25/2017   PLT 381 01/25/2017   NEUTROABS 3.6 01/25/2017    ASSESSMENT & PLAN:  Carcinoid tumor of small intestine Well-differentiated neuroendocrine tumor, carcinoid with 1/3 positive lymph node and serosal invasion Dr. Rosendo Gros who performed small bowel resection with anastomosis on 07/27/2016.   CT abdomen pelvis 01/25/2017: No enhancing lesions no bowel obstruction no liver metastases no lymph nodes Blood work: CBC CMP and normal.  Plan: Surveillance Return to clinic in 6 months with labs and follow-up. Plan would be to obtain a CT scan in one year. If that CT scan is normal and we will plan for CT scans in 3 years that point. Patient class to go to [place] and [place] for charity work  I spent 25 minutes talking to the patient of which more than half was spent in counseling and coordination of care.  No orders of the defined types were placed in this encounter.  The patient has a good understanding of the overall plan. she agrees with it. she will call with any problems that may develop before the next visit here.   Rulon Eisenmenger, MD 02/01/17

## 2017-07-19 ENCOUNTER — Other Ambulatory Visit (HOSPITAL_BASED_OUTPATIENT_CLINIC_OR_DEPARTMENT_OTHER): Payer: Medicare Other

## 2017-07-19 DIAGNOSIS — Z85060 Personal history of malignant carcinoid tumor of small intestine: Secondary | ICD-10-CM

## 2017-07-19 DIAGNOSIS — D3A019 Benign carcinoid tumor of the small intestine, unspecified portion: Secondary | ICD-10-CM

## 2017-07-19 LAB — CBC WITH DIFFERENTIAL/PLATELET
BASO%: 0.3 % (ref 0.0–2.0)
BASOS ABS: 0 10*3/uL (ref 0.0–0.1)
EOS ABS: 0.2 10*3/uL (ref 0.0–0.5)
EOS%: 2.3 % (ref 0.0–7.0)
HEMATOCRIT: 42.6 % (ref 34.8–46.6)
HEMOGLOBIN: 13.9 g/dL (ref 11.6–15.9)
LYMPH#: 2.3 10*3/uL (ref 0.9–3.3)
LYMPH%: 35.6 % (ref 14.0–49.7)
MCH: 30.5 pg (ref 25.1–34.0)
MCHC: 32.6 g/dL (ref 31.5–36.0)
MCV: 93.6 fL (ref 79.5–101.0)
MONO#: 0.7 10*3/uL (ref 0.1–0.9)
MONO%: 10.8 % (ref 0.0–14.0)
NEUT#: 3.3 10*3/uL (ref 1.5–6.5)
NEUT%: 51 % (ref 38.4–76.8)
Platelets: 342 10*3/uL (ref 145–400)
RBC: 4.55 10*6/uL (ref 3.70–5.45)
RDW: 12.6 % (ref 11.2–14.5)
WBC: 6.5 10*3/uL (ref 3.9–10.3)

## 2017-07-19 LAB — COMPREHENSIVE METABOLIC PANEL
ALBUMIN: 3.5 g/dL (ref 3.5–5.0)
ALK PHOS: 60 U/L (ref 40–150)
ALT: 16 U/L (ref 0–55)
AST: 22 U/L (ref 5–34)
Anion Gap: 8 mEq/L (ref 3–11)
BUN: 20 mg/dL (ref 7.0–26.0)
CALCIUM: 9.4 mg/dL (ref 8.4–10.4)
CO2: 28 mEq/L (ref 22–29)
Chloride: 106 mEq/L (ref 98–109)
Creatinine: 0.9 mg/dL (ref 0.6–1.1)
EGFR: 67 mL/min/{1.73_m2} — AB (ref >90)
Glucose: 82 mg/dl (ref 70–140)
POTASSIUM: 3.9 meq/L (ref 3.5–5.1)
Sodium: 142 mEq/L (ref 136–145)
Total Bilirubin: 0.42 mg/dL (ref 0.20–1.20)
Total Protein: 6.8 g/dL (ref 6.4–8.3)

## 2017-07-20 LAB — CHROMOGRANIN A: Chromogranin A: 2 nmol/L (ref 0–5)

## 2017-07-26 ENCOUNTER — Ambulatory Visit (HOSPITAL_BASED_OUTPATIENT_CLINIC_OR_DEPARTMENT_OTHER): Payer: Medicare Other | Admitting: Hematology and Oncology

## 2017-07-26 ENCOUNTER — Encounter: Payer: Self-pay | Admitting: Hematology and Oncology

## 2017-07-26 DIAGNOSIS — Z85060 Personal history of malignant carcinoid tumor of small intestine: Secondary | ICD-10-CM

## 2017-07-26 DIAGNOSIS — D3A019 Benign carcinoid tumor of the small intestine, unspecified portion: Secondary | ICD-10-CM

## 2017-07-26 NOTE — Assessment & Plan Note (Signed)
Well-differentiated neuroendocrine tumor, carcinoid with 1/3 positive lymph node and serosal invasion Dr. Rosendo Gros who performed small bowel resection with anastomosis on 07/27/2016.   CT abdomen pelvis 01/25/2017: No enhancing lesions no bowel obstruction no liver metastases no lymph nodes Blood work: CBC CMP and normal.  Plan: Surveillance Return to clinic in 6 months with labs and CT scan. If that CT scan is normal, we will plan for CT scans in 3 years that point. Patient plans to go to [place] and [place] for charity work

## 2017-07-26 NOTE — Progress Notes (Signed)
Patient Care Team: Carol Ada, MD as PCP - General (Family Medicine)  DIAGNOSIS:  Encounter Diagnosis  Name Primary?  . Carcinoid tumor of small intestine     CHIEF COMPLIANT: Follow-up of carcinoid tumor  INTERVAL HISTORY: Anne Stark is a 66 year old with above-mentioned history of resected carcinoid tumor with positive lymph node who is here for a six-month follow-up visit. She reports that her health has been excellent. Denies abdominal pain flushing diarrhea or any other abdominal symptoms. She had blood work including chromogranin A which was normal.  REVIEW OF SYSTEMS:   Constitutional: Denies fevers, chills or abnormal weight loss Eyes: Denies blurriness of vision Ears, nose, mouth, throat, and face: Denies mucositis or sore throat Respiratory: Denies cough, dyspnea or wheezes Cardiovascular: Denies palpitation, chest discomfort Gastrointestinal:  Denies nausea, heartburn or change in bowel habits Skin: Denies abnormal skin rashes Lymphatics: Denies new lymphadenopathy or easy bruising Neurological:Denies numbness, tingling or new weaknesses Behavioral/Psych: Mood is stable, no new changes  Extremities: No lower extremity edema  All other systems were reviewed with the patient and are negative.  I have reviewed the past medical history, past surgical history, social history and family history with the patient and they are unchanged from previous note.  ALLERGIES:  has No Known Allergies.  MEDICATIONS:  Current Outpatient Prescriptions  Medication Sig Dispense Refill  . albuterol (PROVENTIL HFA;VENTOLIN HFA) 108 (90 BASE) MCG/ACT inhaler Inhale 2 puffs into the lungs every 6 (six) hours as needed. For shortness of breath or wheeze    . aspirin EC 81 MG tablet Take 81 mg by mouth daily.    Marland Kitchen atorvastatin (LIPITOR) 10 MG tablet Take 10 mg by mouth daily.    Marland Kitchen azelastine (ASTELIN) 137 MCG/SPRAY nasal spray Place 1 spray into the nose 2 (two) times daily as needed  for rhinitis or allergies. Use in each nostril as directed    . furosemide (LASIX) 20 MG tablet Take 20 mg by mouth daily.    . Glucosamine Sulfate 1000 MG CAPS Take 1,000 mg by mouth daily.    Marland Kitchen levothyroxine (SYNTHROID, LEVOTHROID) 50 MCG tablet Take 50 mcg by mouth daily before breakfast.    . lisinopril (PRINIVIL,ZESTRIL) 5 MG tablet Take 1 tablet (5 mg total) by mouth daily.    Marland Kitchen loratadine (CLARITIN) 10 MG tablet Take 10 mg by mouth daily.    . Multiple Vitamin (MULTIVITAMIN WITH MINERALS) TABS Take 1 tablet by mouth daily.    . potassium chloride (K-DUR,KLOR-CON) 10 MEQ tablet Take 10 mEq by mouth daily.     No current facility-administered medications for this visit.     PHYSICAL EXAMINATION: ECOG PERFORMANCE STATUS: 0 - Asymptomatic  Vitals:   07/26/17 0909  BP: 138/72  Pulse: 66  Resp: 18  Temp: 98.1 F (36.7 C)  SpO2: 99%   Filed Weights   07/26/17 0909  Weight: 151 lb 14.4 oz (68.9 kg)    GENERAL:alert, no distress and comfortable SKIN: skin color, texture, turgor are normal, no rashes or significant lesions EYES: normal, Conjunctiva are pink and non-injected, sclera clear OROPHARYNX:no exudate, no erythema and lips, buccal mucosa, and tongue normal  NECK: supple, thyroid normal size, non-tender, without nodularity LYMPH:  no palpable lymphadenopathy in the cervical, axillary or inguinal LUNGS: clear to auscultation and percussion with normal breathing effort HEART: regular rate & rhythm and no murmurs and no lower extremity edema ABDOMEN:abdomen soft, non-tender and normal bowel sounds MUSCULOSKELETAL:no cyanosis of digits and no clubbing  NEURO: alert &  oriented x 3 with fluent speech, no focal motor/sensory deficits EXTREMITIES: No lower extremity edema   LABORATORY DATA:  I have reviewed the data as listed   Chemistry      Component Value Date/Time   NA 142 07/19/2017 0848   K 3.9 07/19/2017 0848   CL 106 08/01/2016 0446   CO2 28 07/19/2017 0848    BUN 20.0 07/19/2017 0848   CREATININE 0.9 07/19/2017 0848      Component Value Date/Time   CALCIUM 9.4 07/19/2017 0848   ALKPHOS 60 07/19/2017 0848   AST 22 07/19/2017 0848   ALT 16 07/19/2017 0848   BILITOT 0.42 07/19/2017 0848       Lab Results  Component Value Date   WBC 6.5 07/19/2017   HGB 13.9 07/19/2017   HCT 42.6 07/19/2017   MCV 93.6 07/19/2017   PLT 342 07/19/2017   NEUTROABS 3.3 07/19/2017    ASSESSMENT & PLAN:  Carcinoid tumor of small intestine Well-differentiated neuroendocrine tumor, carcinoid with 1/3 positive lymph node and serosal invasion Dr. Rosendo Gros who performed small bowel resection with anastomosis on 07/27/2016.   CT abdomen pelvis 01/25/2017: No enhancing lesions no bowel obstruction no liver metastases no lymph nodes Blood work: CBC CMP and normal. Chromogranin A : 2 (normal) Plan: Surveillance Return to clinic in 6 months with labs and CT scan. If that CT scan is normal, we will plan for CT scans in 3 years that point. Patient plans to go to [place] and [place] for charity work  Return to clinic after scans and labs and follow-up I spent 25 minutes talking to the patient of which more than half was spent in counseling and coordination of care.  Orders Placed This Encounter  Procedures  . CT Abdomen Pelvis W Contrast    Standing Status:   Future    Standing Expiration Date:   07/26/2018    Order Specific Question:   If indicated for the ordered procedure, I authorize the administration of contrast media per Radiology protocol    Answer:   Yes    Order Specific Question:   Preferred imaging location?    Answer:   [facility]    Order Specific Question:   Radiology Contrast Protocol - do NOT remove file path    Answer:   [email or web address]    Order Specific Question:   Reason for Exam additional comments    Answer:   Abdomiinal carcinoid tumor  . CBC with Differential    Standing Status:   Future    Standing  Expiration Date:   07/26/2018  . Comprehensive metabolic panel    Standing Status:   Future    Standing Expiration Date:   07/26/2018  . Chromogranin A    Standing Status:   Future    Standing Expiration Date:   07/26/2018   The patient has a good understanding of the overall plan. she agrees with it. she will call with any problems that may develop before the next visit here.   Rulon Eisenmenger, MD 07/26/17

## 2018-01-18 ENCOUNTER — Encounter (HOSPITAL_COMMUNITY): Payer: Self-pay

## 2018-01-18 ENCOUNTER — Ambulatory Visit (HOSPITAL_COMMUNITY)
Admission: RE | Admit: 2018-01-18 | Discharge: 2018-01-18 | Disposition: A | Discharge status: Home / Self Care | Payer: Medicare Other | Source: Ambulatory Visit | Attending: Hematology and Oncology | Admitting: Hematology and Oncology

## 2018-01-18 ENCOUNTER — Inpatient Hospital Stay: Payer: Medicare Other | Attending: Hematology and Oncology

## 2018-01-18 DIAGNOSIS — K449 Diaphragmatic hernia without obstruction or gangrene: Secondary | ICD-10-CM | POA: Insufficient documentation

## 2018-01-18 DIAGNOSIS — R911 Solitary pulmonary nodule: Secondary | ICD-10-CM | POA: Diagnosis not present

## 2018-01-18 DIAGNOSIS — D3A019 Benign carcinoid tumor of the small intestine, unspecified portion: Secondary | ICD-10-CM | POA: Insufficient documentation

## 2018-01-18 DIAGNOSIS — I700 Atherosclerosis of aorta: Secondary | ICD-10-CM | POA: Diagnosis not present

## 2018-01-18 DIAGNOSIS — Z85060 Personal history of malignant carcinoid tumor of small intestine: Secondary | ICD-10-CM | POA: Diagnosis not present

## 2018-01-18 LAB — CBC WITH DIFFERENTIAL/PLATELET
Basophils Absolute: 0 10*3/uL (ref 0.0–0.1)
Basophils Relative: 0 %
EOS ABS: 0.1 10*3/uL (ref 0.0–0.5)
EOS PCT: 2 %
HCT: 44.9 % (ref 34.8–46.6)
HEMOGLOBIN: 14.5 g/dL (ref 11.6–15.9)
Lymphocytes Relative: 37 %
Lymphs Abs: 2.1 10*3/uL (ref 0.9–3.3)
MCH: 30.1 pg (ref 25.1–34.0)
MCHC: 32.3 g/dL (ref 31.5–36.0)
MCV: 93.3 fL (ref 79.5–101.0)
MONOS PCT: 8 %
Monocytes Absolute: 0.5 10*3/uL (ref 0.1–0.9)
NEUTROS PCT: 53 %
Neutro Abs: 3 10*3/uL (ref 1.5–6.5)
PLATELETS: 342 10*3/uL (ref 145–400)
RBC: 4.81 MIL/uL (ref 3.70–5.45)
RDW: 12.6 % (ref 11.2–14.5)
WBC: 5.7 10*3/uL (ref 3.9–10.3)

## 2018-01-18 LAB — COMPREHENSIVE METABOLIC PANEL
ALK PHOS: 74 U/L (ref 40–150)
ALT: 20 U/L (ref 0–55)
AST: 26 U/L (ref 5–34)
Albumin: 3.9 g/dL (ref 3.5–5.0)
Anion gap: 11 (ref 3–11)
BUN: 14 mg/dL (ref 7–26)
CALCIUM: 9.7 mg/dL (ref 8.4–10.4)
CO2: 27 mmol/L (ref 22–29)
CREATININE: 0.87 mg/dL (ref 0.60–1.10)
Chloride: 106 mmol/L (ref 98–109)
Glucose, Bld: 87 mg/dL (ref 70–140)
Potassium: 4 mmol/L (ref 3.5–5.1)
SODIUM: 144 mmol/L (ref 136–145)
Total Bilirubin: 0.8 mg/dL (ref 0.2–1.2)
Total Protein: 7.5 g/dL (ref 6.4–8.3)

## 2018-01-18 MED ORDER — IOPAMIDOL (ISOVUE-300) INJECTION 61%
100.0000 mL | Freq: Once | INTRAVENOUS | Status: AC | PRN
  Administered 2018-01-18: 100 mL via INTRAVENOUS

## 2018-01-18 MED ORDER — IOPAMIDOL (ISOVUE-300) INJECTION 61%
INTRAVENOUS | Status: AC
  Administered 2018-01-18: 100 mL via INTRAVENOUS
  Filled 2018-01-18: qty 100

## 2018-01-20 LAB — CHROMOGRANIN A: CHROMOGRANIN A: 2 nmol/L (ref 0–5)

## 2018-01-24 NOTE — Assessment & Plan Note (Addendum)
Well-differentiated neuroendocrine tumor, carcinoid with 1/3 positive lymph node and serosal invasion Dr. Rosendo Gros who performed small bowel resection with anastomosis on 07/27/2016.   CT abdomen pelvis 01/25/2017: No enhancing lesions no bowel obstruction no liver metastases no lymph nodes Blood work: CBC CMP and normal. Chromogranin A : 2 (normal)  Plan: Surveillance CT abd and Pelvis: No evidence of tumor recurrence. 3 mm RLL lung nodule probably benign  Since the CT scan is normal, we will plan for CT scans in 3 years. Patient went to [place] for charity work  Return to clinic in 1 year

## 2018-01-25 ENCOUNTER — Inpatient Hospital Stay (HOSPITAL_BASED_OUTPATIENT_CLINIC_OR_DEPARTMENT_OTHER): Payer: Medicare Other | Admitting: Hematology and Oncology

## 2018-01-25 ENCOUNTER — Telehealth: Payer: Self-pay

## 2018-01-25 DIAGNOSIS — Z85060 Personal history of malignant carcinoid tumor of small intestine: Secondary | ICD-10-CM | POA: Diagnosis not present

## 2018-01-25 DIAGNOSIS — R911 Solitary pulmonary nodule: Secondary | ICD-10-CM

## 2018-01-25 DIAGNOSIS — D3A019 Benign carcinoid tumor of the small intestine, unspecified portion: Secondary | ICD-10-CM

## 2018-01-25 NOTE — Progress Notes (Signed)
Patient Care Team: Carol Ada, MD as PCP - General (Family Medicine)  DIAGNOSIS:  Encounter Diagnosis  Name Primary?  . Carcinoid tumor of small intestine    CHIEF COMPLIANT: Follow-up of carcinoid tumor of the small intestine after recent CT scans  INTERVAL HISTORY: Anne Stark is a 67 year old with above-mentioned history of carcinoid tumor of the small intestine who is here for annual follow-up.  She has had no major problems or concerns.  She denies any abdominal pain or flushing sensation or diarrhea.  CT scans do not show any evidence of disease recurrence  REVIEW OF SYSTEMS:   Constitutional: Denies fevers, chills or abnormal weight loss Eyes: Denies blurriness of vision Ears, nose, mouth, throat, and face: Denies mucositis or sore throat Respiratory: Denies cough, dyspnea or wheezes Cardiovascular: Denies palpitation, chest discomfort Gastrointestinal:  Denies nausea, heartburn or change in bowel habits Skin: Denies abnormal skin rashes Lymphatics: Denies new lymphadenopathy or easy bruising Neurological:Denies numbness, tingling or new weaknesses Behavioral/Psych: Mood is stable, no new changes  Extremities: No lower extremity edema  All other systems were reviewed with the patient and are negative.  I have reviewed the past medical history, past surgical history, social history and family history with the patient and they are unchanged from previous note.  ALLERGIES:  has No Known Allergies.  MEDICATIONS:  Current Outpatient Medications  Medication Sig Dispense Refill  . albuterol (PROVENTIL HFA;VENTOLIN HFA) 108 (90 BASE) MCG/ACT inhaler Inhale 2 puffs into the lungs every 6 (six) hours as needed. For shortness of breath or wheeze    . aspirin EC 81 MG tablet Take 81 mg by mouth daily.    Marland Kitchen atorvastatin (LIPITOR) 10 MG tablet Take 10 mg by mouth daily.    Marland Kitchen azelastine (ASTELIN) 137 MCG/SPRAY nasal spray Place 1 spray into the nose 2 (two) times daily as  needed for rhinitis or allergies. Use in each nostril as directed    . furosemide (LASIX) 20 MG tablet Take 20 mg by mouth daily.    . Glucosamine Sulfate 1000 MG CAPS Take 1,000 mg by mouth daily.    Marland Kitchen levothyroxine (SYNTHROID, LEVOTHROID) 50 MCG tablet Take 50 mcg by mouth daily before breakfast.    . lisinopril (PRINIVIL,ZESTRIL) 5 MG tablet Take 1 tablet (5 mg total) by mouth daily.    Marland Kitchen loratadine (CLARITIN) 10 MG tablet Take 10 mg by mouth daily.    . Multiple Vitamin (MULTIVITAMIN WITH MINERALS) TABS Take 1 tablet by mouth daily.    . potassium chloride (K-DUR,KLOR-CON) 10 MEQ tablet Take 10 mEq by mouth daily.     No current facility-administered medications for this visit.     PHYSICAL EXAMINATION: ECOG PERFORMANCE STATUS: 1 - Symptomatic but completely ambulatory  Vitals:   01/25/18 1425  BP: 128/71  Pulse: 66  Resp: 17  Temp: 97.7 F (36.5 C)  SpO2: 100%   Filed Weights   01/25/18 1425  Weight: 147 lb 9.6 oz (67 kg)    GENERAL:alert, no distress and comfortable SKIN: skin color, texture, turgor are normal, no rashes or significant lesions EYES: normal, Conjunctiva are pink and non-injected, sclera clear OROPHARYNX:no exudate, no erythema and lips, buccal mucosa, and tongue normal  NECK: supple, thyroid normal size, non-tender, without nodularity LYMPH:  no palpable lymphadenopathy in the cervical, axillary or inguinal LUNGS: clear to auscultation and percussion with normal breathing effort HEART: regular rate & rhythm and no murmurs and no lower extremity edema ABDOMEN:abdomen soft, non-tender and normal bowel sounds MUSCULOSKELETAL:no  cyanosis of digits and no clubbing  NEURO: alert & oriented x 3 with fluent speech, no focal motor/sensory deficits EXTREMITIES: No lower extremity edema  LABORATORY DATA:  I have reviewed the data as listed CMP Latest Ref Rng & Units 01/18/2018 07/19/2017 01/25/2017  Glucose 70 - 140 mg/dL 87 82 97  BUN 7 - 26 mg/dL 14 20.0 16.5    Creatinine 0.60 - 1.10 mg/dL 0.87 0.9 0.9  Sodium 136 - 145 mmol/L 144 142 142  Potassium 3.5 - 5.1 mmol/L 4.0 3.9 4.2  Chloride 98 - 109 mmol/L 106 - -  CO2 22 - 29 mmol/L 27 28 29   Calcium 8.4 - 10.4 mg/dL 9.7 9.4 9.7  Total Protein 6.4 - 8.3 g/dL 7.5 6.8 7.5  Total Bilirubin 0.2 - 1.2 mg/dL 0.8 0.42 0.62  Alkaline Phos 40 - 150 U/L 74 60 64  AST 5 - 34 U/L 26 22 27   ALT 0 - 55 U/L 20 16 25     Lab Results  Component Value Date   WBC 5.7 01/18/2018   HGB 14.5 01/18/2018   HCT 44.9 01/18/2018   MCV 93.3 01/18/2018   PLT 342 01/18/2018   NEUTROABS 3.0 01/18/2018    ASSESSMENT & PLAN:  Carcinoid tumor of small intestine Well-differentiated neuroendocrine tumor, carcinoid with 1/3 positive lymph node and serosal invasion Dr. Rosendo Gros who performed small bowel resection with anastomosis on 07/27/2016.   CT abdomen pelvis 01/25/2017: No enhancing lesions no bowel obstruction no liver metastases no lymph nodes Blood work: CBC CMP and normal. Chromogranin A : 2 (normal)  Plan: Surveillance CT abd and Pelvis: No evidence of tumor recurrence. 3 mm RLL lung nodule probably benign  Since the CT scan is normal, we will plan for CT scans in 3 years. Patient went to [place] for charity work.  She plans to go to [place], [place], [place] for more charity work.  Return to clinic in 1 year  I spent 25 minutes talking to the patient of which more than half was spent in counseling and coordination of care.  No orders of the defined types were placed in this encounter.  The patient has a good understanding of the overall plan. she agrees with it. she will call with any problems that may develop before the next visit here.   Harriette Ohara, MD 01/25/18

## 2018-01-25 NOTE — Telephone Encounter (Signed)
Printed avs and calender of upcoming appointment. Per 2/27 los 

## 2019-01-11 ENCOUNTER — Telehealth: Payer: Self-pay | Admitting: Hematology and Oncology

## 2019-01-11 NOTE — Telephone Encounter (Signed)
[facility] 2/26 moved f/u to 2/25. Other appointments remain the same. Spoke with patient.

## 2019-01-17 ENCOUNTER — Inpatient Hospital Stay: Payer: Medicare Other | Attending: Hematology and Oncology

## 2019-01-17 ENCOUNTER — Encounter (HOSPITAL_COMMUNITY): Payer: Self-pay

## 2019-01-17 ENCOUNTER — Ambulatory Visit (HOSPITAL_COMMUNITY)
Admission: RE | Admit: 2019-01-17 | Discharge: 2019-01-17 | Disposition: A | Discharge status: Home / Self Care | Payer: Medicare Other | Source: Ambulatory Visit | Attending: Hematology and Oncology | Admitting: Hematology and Oncology

## 2019-01-17 DIAGNOSIS — Z79899 Other long term (current) drug therapy: Secondary | ICD-10-CM | POA: Insufficient documentation

## 2019-01-17 DIAGNOSIS — Z7982 Long term (current) use of aspirin: Secondary | ICD-10-CM | POA: Insufficient documentation

## 2019-01-17 DIAGNOSIS — C7A019 Malignant carcinoid tumor of the small intestine, unspecified portion: Secondary | ICD-10-CM | POA: Diagnosis present

## 2019-01-17 DIAGNOSIS — D3A019 Benign carcinoid tumor of the small intestine, unspecified portion: Secondary | ICD-10-CM

## 2019-01-17 LAB — CMP (CANCER CENTER ONLY)
ALK PHOS: 77 U/L (ref 38–126)
ALT: 18 U/L (ref 0–44)
ANION GAP: 8 (ref 5–15)
AST: 25 U/L (ref 15–41)
Albumin: 4.1 g/dL (ref 3.5–5.0)
BILIRUBIN TOTAL: 0.6 mg/dL (ref 0.3–1.2)
BUN: 17 mg/dL (ref 8–23)
CALCIUM: 9.7 mg/dL (ref 8.9–10.3)
CO2: 31 mmol/L (ref 22–32)
CREATININE: 0.98 mg/dL (ref 0.44–1.00)
Chloride: 103 mmol/L (ref 98–111)
GFR, EST NON AFRICAN AMERICAN: 59 mL/min — AB (ref >60)
Glucose, Bld: 90 mg/dL (ref 70–99)
Potassium: 4.3 mmol/L (ref 3.5–5.1)
SODIUM: 142 mmol/L (ref 135–145)
TOTAL PROTEIN: 7.5 g/dL (ref 6.5–8.1)

## 2019-01-17 LAB — CBC WITH DIFFERENTIAL (CANCER CENTER ONLY)
ABS IMMATURE GRANULOCYTES: 0.02 10*3/uL (ref 0.00–0.07)
Basophils Absolute: 0 10*3/uL (ref 0.0–0.1)
Basophils Relative: 0 %
EOS PCT: 2 %
Eosinophils Absolute: 0.1 10*3/uL (ref 0.0–0.5)
HEMATOCRIT: 47.4 % — AB (ref 36.0–46.0)
HEMOGLOBIN: 15 g/dL (ref 12.0–15.0)
Immature Granulocytes: 0 %
LYMPHS ABS: 2.4 10*3/uL (ref 0.7–4.0)
LYMPHS PCT: 40 %
MCH: 30.1 pg (ref 26.0–34.0)
MCHC: 31.6 g/dL (ref 30.0–36.0)
MCV: 95 fL (ref 80.0–100.0)
MONO ABS: 0.5 10*3/uL (ref 0.1–1.0)
MONOS PCT: 9 %
NEUTROS ABS: 3 10*3/uL (ref 1.7–7.7)
Neutrophils Relative %: 49 %
Platelet Count: 358 10*3/uL (ref 150–400)
RBC: 4.99 MIL/uL (ref 3.87–5.11)
RDW: 11.7 % (ref 11.5–15.5)
WBC: 6.1 10*3/uL (ref 4.0–10.5)
nRBC: 0 % (ref 0.0–0.2)

## 2019-01-17 MED ORDER — IOHEXOL 300 MG/ML  SOLN
100.0000 mL | Freq: Once | INTRAMUSCULAR | Status: AC | PRN
  Administered 2019-01-17: 100 mL via INTRAVENOUS

## 2019-01-17 MED ORDER — SODIUM CHLORIDE (PF) 0.9 % IJ SOLN
INTRAMUSCULAR | Status: AC
  Filled 2019-01-17: qty 50

## 2019-01-18 LAB — CHROMOGRANIN A REBASELINE
CHROMOGRANIN A: 1 nmol/L (ref 0–5)
Chromogranin A (ng/mL): 67.8 ng/mL (ref 0.0–101.8)

## 2019-01-19 NOTE — Progress Notes (Signed)
Patient Care Team: Carol Ada, MD as PCP - General (Family Medicine)  DIAGNOSIS:    ICD-10-CM   1. Malignant carcinoid tumor of small intestine, unspecified location [facility]) C7A.019 CT Abdomen Pelvis W Contrast    Chromogranin A    CHIEF COMPLIANT: Follow-up of carcinoid tumor of the small intestine   INTERVAL HISTORY: Anne Stark is a 68 y.o. with above-mentioned history of carcinoid tumor of the small intestine. I last saw the patient one year ago. A CT abdomen pelvis on 01/17/19 showed no evidence of recurrent or metastatic disease. She presents to the clinic alone today and is doing well. Her labs from 01/17/19 were WNL. She reviewed her medication list with me.   REVIEW OF SYSTEMS:   Constitutional: Denies fevers, chills or abnormal weight loss Eyes: Denies blurriness of vision Ears, nose, mouth, throat, and face: Denies mucositis or sore throat Respiratory: Denies cough, dyspnea or wheezes Cardiovascular: Denies palpitation, chest discomfort Gastrointestinal: Denies nausea, heartburn or change in bowel habits Skin: Denies abnormal skin rashes Lymphatics: Denies new lymphadenopathy or easy bruising Neurological: Denies numbness, tingling or new weaknesses Behavioral/Psych: Mood is stable, no new changes  Extremities: No lower extremity edema Breast: denies any pain or lumps or nodules in either breasts All other systems were reviewed with the patient and are negative.  I have reviewed the past medical history, past surgical history, social history and family history with the patient and they are unchanged from previous note.  ALLERGIES:  has No Known Allergies.  MEDICATIONS:  Current Outpatient Medications  Medication Sig Dispense Refill  . albuterol (PROVENTIL HFA;VENTOLIN HFA) 108 (90 BASE) MCG/ACT inhaler Inhale 2 puffs into the lungs every 6 (six) hours as needed. For shortness of breath or wheeze    . aspirin EC 81 MG tablet Take 81 mg by mouth daily.    Marland Kitchen  atorvastatin (LIPITOR) 10 MG tablet Take 10 mg by mouth daily.    Marland Kitchen azelastine (ASTELIN) 137 MCG/SPRAY nasal spray Place 1 spray into the nose 2 (two) times daily as needed for rhinitis or allergies. Use in each nostril as directed    . furosemide (LASIX) 20 MG tablet Take 20 mg by mouth daily.    . Glucosamine Sulfate 1000 MG CAPS Take 1,000 mg by mouth daily.    Marland Kitchen levothyroxine (SYNTHROID, LEVOTHROID) 50 MCG tablet Take 50 mcg by mouth daily before breakfast.    . lisinopril (PRINIVIL,ZESTRIL) 5 MG tablet Take 1 tablet (5 mg total) by mouth daily.    Marland Kitchen loratadine (CLARITIN) 10 MG tablet Take 10 mg by mouth daily.    . Multiple Vitamin (MULTIVITAMIN WITH MINERALS) TABS Take 1 tablet by mouth daily.    . potassium chloride (K-DUR,KLOR-CON) 10 MEQ tablet Take 10 mEq by mouth daily.     No current facility-administered medications for this visit.     PHYSICAL EXAMINATION: ECOG PERFORMANCE STATUS: 1 - Symptomatic but completely ambulatory  Vitals:   01/23/19 1058  BP: (!) 155/89  Pulse: 69  Resp: 18  Temp: 98.2 F (36.8 C)  SpO2: 100%   Filed Weights   01/23/19 1058  Weight: 152 lb 8 oz (69.2 kg)    GENERAL: alert, no distress and comfortable SKIN: skin color, texture, turgor are normal, no rashes or significant lesions EYES: normal, Conjunctiva are pink and non-injected, sclera clear OROPHARYNX: no exudate, no erythema and lips, buccal mucosa, and tongue normal  NECK: supple, thyroid normal size, non-tender, without nodularity LYMPH: no palpable lymphadenopathy in the cervical,  axillary or inguinal LUNGS: clear to auscultation and percussion with normal breathing effort HEART: regular rate & rhythm and no murmurs and no lower extremity edema ABDOMEN: abdomen soft, non-tender and normal bowel sounds MUSCULOSKELETAL: no cyanosis of digits and no clubbing  NEURO: alert & oriented x 3 with fluent speech, no focal motor/sensory deficits EXTREMITIES: No lower extremity  edema  LABORATORY DATA:  I have reviewed the data as listed CMP Latest Ref Rng & Units 01/17/2019 01/18/2018 07/19/2017  Glucose 70 - 99 mg/dL 90 87 82  BUN 8 - 23 mg/dL 17 14 20.0  Creatinine 0.44 - 1.00 mg/dL 0.98 0.87 0.9  Sodium 135 - 145 mmol/L 142 144 142  Potassium 3.5 - 5.1 mmol/L 4.3 4.0 3.9  Chloride 98 - 111 mmol/L 103 106 -  CO2 22 - 32 mmol/L 31 27 28   Calcium 8.9 - 10.3 mg/dL 9.7 9.7 9.4  Total Protein 6.5 - 8.1 g/dL 7.5 7.5 6.8  Total Bilirubin 0.3 - 1.2 mg/dL 0.6 0.8 0.42  Alkaline Phos 38 - 126 U/L 77 74 60  AST 15 - 41 U/L 25 26 22   ALT 0 - 44 U/L 18 20 16     Lab Results  Component Value Date   WBC 6.1 01/17/2019   HGB 15.0 01/17/2019   HCT 47.4 (H) 01/17/2019   MCV 95.0 01/17/2019   PLT 358 01/17/2019   NEUTROABS 3.0 01/17/2019    ASSESSMENT & PLAN:  Carcinoid tumor of small intestine Well-differentiated neuroendocrine tumor, carcinoid with 1/3 positive lymph node and serosal invasion Dr. Rosendo Gros who performed small bowel resection with anastomosis on 07/27/2016.   Blood work 01/17/2019: CBC CMP and normal. Chromogranin A:1 (normal)  Plan: Surveillance 01/17/2019: CT abd and Pelvis: No evidence of tumor recurrence. 3 mm RLL lung nodule probably benign Since the CT scan is normal,we will plan for CT scans in 1 yr and after that we can do CTs on an as-needed basis. 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  Return to clinic in 1 year    Orders Placed This Encounter  Procedures  . CT Abdomen Pelvis W Contrast    Standing Status:   Future    Standing Expiration Date:   01/23/2020    Order Specific Question:   ** REASON FOR EXAM (FREE TEXT)    Answer:   Resected carcinoid eval for recurrence    Order Specific Question:   If indicated for the ordered procedure, I authorize the administration of contrast media per Radiology protocol    Answer:   Yes    Order Specific Question:   Preferred imaging location?    Answer:   [facility]    Order Specific Question:   Is Oral Contrast requested for this exam?    Answer:   Yes, Per Radiology protocol    Order Specific Question:   Radiology Contrast Protocol - do NOT remove file path    Answer:   \\[email or web address]  . Chromogranin A    Standing Status:   Future    Standing Expiration Date:   01/23/2020   The patient has a good understanding of the overall plan. she agrees with it. she will call with any problems that may develop before the next visit here.  Nicholas Lose, MD 01/23/2019  Julious Oka Dorshimer am acting as scribe for Dr. Nicholas Lose.  I have reviewed the above documentation for accuracy and completeness, and I agree with the above.

## 2019-01-23 ENCOUNTER — Telehealth: Payer: Self-pay | Admitting: Hematology and Oncology

## 2019-01-23 ENCOUNTER — Inpatient Hospital Stay: Payer: Medicare Other | Admitting: Hematology and Oncology

## 2019-01-23 DIAGNOSIS — C7A019 Malignant carcinoid tumor of the small intestine, unspecified portion: Secondary | ICD-10-CM | POA: Diagnosis not present

## 2019-01-23 DIAGNOSIS — Z79899 Other long term (current) drug therapy: Secondary | ICD-10-CM

## 2019-01-23 DIAGNOSIS — Z7982 Long term (current) use of aspirin: Secondary | ICD-10-CM | POA: Diagnosis not present

## 2019-01-23 NOTE — Assessment & Plan Note (Signed)
Well-differentiated neuroendocrine tumor, carcinoid with 1/3 positive lymph node and serosal invasion Dr. Rosendo Gros who performed small bowel resection with anastomosis on 07/27/2016.   Blood work 01/17/2019: CBC CMP and normal. Chromogranin A:1 (normal)  Plan: Surveillance 01/17/2019: CT abd and Pelvis: No evidence of tumor recurrence. 3 mm RLL lung nodule probably benign Since the CT scan is normal,we will plan for CT scans in 3 years. 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  Return to clinic in 1 year

## 2019-01-23 NOTE — Telephone Encounter (Signed)
Gave avs and calendar ° °

## 2019-01-24 ENCOUNTER — Ambulatory Visit: Payer: Medicare Other | Admitting: Hematology and Oncology

## 2020-01-21 ENCOUNTER — Other Ambulatory Visit: Payer: Self-pay | Admitting: *Deleted

## 2020-01-21 DIAGNOSIS — C7A019 Malignant carcinoid tumor of the small intestine, unspecified portion: Secondary | ICD-10-CM

## 2020-01-22 ENCOUNTER — Inpatient Hospital Stay: Payer: Medicare PPO | Attending: Hematology and Oncology

## 2020-01-22 ENCOUNTER — Other Ambulatory Visit: Payer: Self-pay

## 2020-01-22 ENCOUNTER — Ambulatory Visit (HOSPITAL_COMMUNITY)
Admission: RE | Admit: 2020-01-22 | Discharge: 2020-01-22 | Disposition: A | Discharge status: Home / Self Care | Payer: Medicare PPO | Source: Ambulatory Visit | Attending: Hematology and Oncology | Admitting: Hematology and Oncology

## 2020-01-22 DIAGNOSIS — C7A019 Malignant carcinoid tumor of the small intestine, unspecified portion: Secondary | ICD-10-CM

## 2020-01-22 DIAGNOSIS — R911 Solitary pulmonary nodule: Secondary | ICD-10-CM | POA: Diagnosis not present

## 2020-01-22 DIAGNOSIS — K449 Diaphragmatic hernia without obstruction or gangrene: Secondary | ICD-10-CM | POA: Diagnosis not present

## 2020-01-22 DIAGNOSIS — Z9049 Acquired absence of other specified parts of digestive tract: Secondary | ICD-10-CM | POA: Diagnosis not present

## 2020-01-22 DIAGNOSIS — Z85060 Personal history of malignant carcinoid tumor of small intestine: Secondary | ICD-10-CM | POA: Diagnosis present

## 2020-01-22 LAB — CBC WITH DIFFERENTIAL (CANCER CENTER ONLY)
Abs Immature Granulocytes: 0.02 10*3/uL (ref 0.00–0.07)
Basophils Absolute: 0.1 10*3/uL (ref 0.0–0.1)
Basophils Relative: 1 %
Eosinophils Absolute: 0.2 10*3/uL (ref 0.0–0.5)
Eosinophils Relative: 2 %
HCT: 47 % — ABNORMAL HIGH (ref 36.0–46.0)
Hemoglobin: 15.4 g/dL — ABNORMAL HIGH (ref 12.0–15.0)
Immature Granulocytes: 0 %
Lymphocytes Relative: 36 %
Lymphs Abs: 2.2 10*3/uL (ref 0.7–4.0)
MCH: 30 pg (ref 26.0–34.0)
MCHC: 32.8 g/dL (ref 30.0–36.0)
MCV: 91.4 fL (ref 80.0–100.0)
Monocytes Absolute: 0.5 10*3/uL (ref 0.1–1.0)
Monocytes Relative: 9 %
Neutro Abs: 3.2 10*3/uL (ref 1.7–7.7)
Neutrophils Relative %: 52 %
Platelet Count: 370 10*3/uL (ref 150–400)
RBC: 5.14 MIL/uL — ABNORMAL HIGH (ref 3.87–5.11)
RDW: 12 % (ref 11.5–15.5)
WBC Count: 6.2 10*3/uL (ref 4.0–10.5)
nRBC: 0 % (ref 0.0–0.2)

## 2020-01-22 LAB — CMP (CANCER CENTER ONLY)
ALT: 23 U/L (ref 0–44)
AST: 25 U/L (ref 15–41)
Albumin: 4 g/dL (ref 3.5–5.0)
Alkaline Phosphatase: 65 U/L (ref 38–126)
Anion gap: 10 (ref 5–15)
BUN: 16 mg/dL (ref 8–23)
CO2: 28 mmol/L (ref 22–32)
Calcium: 9.4 mg/dL (ref 8.9–10.3)
Chloride: 106 mmol/L (ref 98–111)
Creatinine: 0.9 mg/dL (ref 0.44–1.00)
GFR, Est AFR Am: >60 mL/min (ref >60)
GFR, Estimated: >60 mL/min (ref >60)
Glucose, Bld: 99 mg/dL (ref 70–99)
Potassium: 4.5 mmol/L (ref 3.5–5.1)
Sodium: 144 mmol/L (ref 135–145)
Total Bilirubin: 0.7 mg/dL (ref 0.3–1.2)
Total Protein: 7.4 g/dL (ref 6.5–8.1)

## 2020-01-22 MED ORDER — IOHEXOL 300 MG/ML  SOLN
100.0000 mL | Freq: Once | INTRAMUSCULAR | Status: AC | PRN
  Administered 2020-01-22: 100 mL via INTRAVENOUS

## 2020-01-22 MED ORDER — SODIUM CHLORIDE (PF) 0.9 % IJ SOLN
INTRAMUSCULAR | Status: AC
  Filled 2020-01-22: qty 50

## 2020-01-23 LAB — CHROMOGRANIN A: Chromogranin A (ng/mL): 83.7 ng/mL (ref 0.0–101.8)

## 2020-01-23 NOTE — Progress Notes (Signed)
Patient Care Team: Carol Ada, MD as PCP - General (Family Medicine)  DIAGNOSIS:    ICD-10-CM   1. Malignant carcinoid tumor of small intestine, unspecified location ([place])  C7A.019     CHIEF COMPLIANT: Follow-up of carcinoid tumor of the small intestine   INTERVAL HISTORY: Anne Stark is a 69 y.o. with above-mentioned history of carcinoid tumor of the small intestine. A CT abdomen pelvis on 01/22/20 showed no evidence of recurrent or metastatic disease. Anne Stark presents to the clinic today for annual follow-up.  Anne Stark does not report any pain or discomfort in the abdomen.  Anne Stark has received her COVID-19 vaccine.  ALLERGIES:  has No Known Allergies.  MEDICATIONS:  Current Outpatient Medications  Medication Sig Dispense Refill  . albuterol (PROVENTIL HFA;VENTOLIN HFA) 108 (90 BASE) MCG/ACT inhaler Inhale 2 puffs into the lungs every 6 (six) hours as needed. For shortness of breath or wheeze    . aspirin EC 81 MG tablet Take 81 mg by mouth daily.    Marland Kitchen atorvastatin (LIPITOR) 10 MG tablet Take 10 mg by mouth daily.    Marland Kitchen azelastine (ASTELIN) 137 MCG/SPRAY nasal spray Place 1 spray into the nose 2 (two) times daily as needed for rhinitis or allergies. Use in each nostril as directed    . furosemide (LASIX) 20 MG tablet Take 20 mg by mouth daily.    . Glucosamine Sulfate 1000 MG CAPS Take 1,000 mg by mouth daily.    Marland Kitchen levothyroxine (SYNTHROID, LEVOTHROID) 50 MCG tablet Take 50 mcg by mouth daily before breakfast.    . lisinopril (PRINIVIL,ZESTRIL) 5 MG tablet Take 1 tablet (5 mg total) by mouth daily.    Marland Kitchen loratadine (CLARITIN) 10 MG tablet Take 10 mg by mouth daily.    . Multiple Vitamin (MULTIVITAMIN WITH MINERALS) TABS Take 1 tablet by mouth daily.    . potassium chloride (K-DUR,KLOR-CON) 10 MEQ tablet Take 10 mEq by mouth daily.     No current facility-administered medications for this visit.    PHYSICAL EXAMINATION: ECOG PERFORMANCE STATUS: 1 - Symptomatic but completely  ambulatory  Vitals:   01/24/20 0827  BP: 138/79  Pulse: 70  Resp: 17  Temp: 98.2 F (36.8 C)  SpO2: 100%     LABORATORY DATA:  I have reviewed the data as listed CMP Latest Ref Rng & Units 01/22/2020 01/17/2019 01/18/2018  Glucose 70 - 99 mg/dL 99 90 87  BUN 8 - 23 mg/dL 16 17 14   Creatinine 0.44 - 1.00 mg/dL 0.90 0.98 0.87  Sodium 135 - 145 mmol/L 144 142 144  Potassium 3.5 - 5.1 mmol/L 4.5 4.3 4.0  Chloride 98 - 111 mmol/L 106 103 106  CO2 22 - 32 mmol/L 28 31 27   Calcium 8.9 - 10.3 mg/dL 9.4 9.7 9.7  Total Protein 6.5 - 8.1 g/dL 7.4 7.5 7.5  Total Bilirubin 0.3 - 1.2 mg/dL 0.7 0.6 0.8  Alkaline Phos 38 - 126 U/L 65 77 74  AST 15 - 41 U/L 25 25 26   ALT 0 - 44 U/L 23 18 20     Lab Results  Component Value Date   WBC 6.2 01/22/2020   HGB 15.4 (H) 01/22/2020   HCT 47.0 (H) 01/22/2020   MCV 91.4 01/22/2020   PLT 370 01/22/2020   NEUTROABS 3.2 01/22/2020    ASSESSMENT & PLAN:  Carcinoid tumor of small intestine Well-differentiated neuroendocrine tumor, carcinoid with 1/3 positive lymph node and serosal invasion Dr. Rosendo Gros who performed small bowel resection with anastomosis on 07/27/2016.  Blood work  01/22/2020: CBC CMP and normal. Chromogranin A:83.7 (normal)  Plan: Surveillance 01/17/2019: CT abd and Pelvis: No evidence of tumor recurrence. 3 mm RLL lung nodule probably benign 01/22/2020: CT abdomen pelvis: No signs of disease recurrence.  Signs of mild bowel resection, moderate hiatal hernia  We plan to do CTs and an as-needed basis in the future. Because of COVID-19 Anne Stark has been unable to travel around the world for charity work.  Return to clinic in 1 year for follow-up.    No orders of the defined types were placed in this encounter.  The patient has a good understanding of the overall plan. Anne Stark agrees with it. Anne Stark will call with any problems that may develop before the next visit here.  Total time spent: 20 mins including face to face time and time  spent for planning, charting and coordination of care  Nicholas Lose, MD 01/24/2020  I, Cloyde Reams Dorshimer, am acting as scribe for Dr. Nicholas Lose.  I have reviewed the above documentation for accuracy and completeness, and I agree with the above.

## 2020-01-24 ENCOUNTER — Other Ambulatory Visit: Payer: Self-pay

## 2020-01-24 ENCOUNTER — Inpatient Hospital Stay: Payer: Medicare PPO | Admitting: Hematology and Oncology

## 2020-01-24 DIAGNOSIS — C7A019 Malignant carcinoid tumor of the small intestine, unspecified portion: Secondary | ICD-10-CM

## 2020-01-24 DIAGNOSIS — Z85060 Personal history of malignant carcinoid tumor of small intestine: Secondary | ICD-10-CM | POA: Diagnosis not present

## 2020-01-24 NOTE — Assessment & Plan Note (Signed)
Well-differentiated neuroendocrine tumor, carcinoid with 1/3 positive lymph node and serosal invasion Dr. Ramirez who performed small bowel resection with anastomosis on 07/27/2016.   Blood work 01/22/2020: CBC CMP and normal. Chromogranin A:83.7 (normal)  Plan: Surveillance 01/17/2019:CT abd and Pelvis: No evidence of tumor recurrence. 3 mm RLL lung nodule probably benign 01/22/2020: CT abdomen pelvis: No signs of disease recurrence.  Signs of mild bowel resection, moderate hiatal hernia  We plan to do CTs and an as-needed basis in the future. 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  Return to clinic in 1 year for follow-up. 

## 2020-01-25 ENCOUNTER — Telehealth: Payer: Self-pay | Admitting: Hematology and Oncology

## 2020-01-25 NOTE — Telephone Encounter (Signed)
I talk with patient regarding schedule  

## 2020-03-28 LAB — COLOGUARD: COLOGUARD: POSITIVE — AB

## 2020-04-02 DIAGNOSIS — R195 Other fecal abnormalities: Secondary | ICD-10-CM | POA: Diagnosis not present

## 2020-04-08 DIAGNOSIS — L814 Other melanin hyperpigmentation: Secondary | ICD-10-CM | POA: Diagnosis not present

## 2020-04-08 DIAGNOSIS — D225 Melanocytic nevi of trunk: Secondary | ICD-10-CM | POA: Diagnosis not present

## 2020-04-08 DIAGNOSIS — D485 Neoplasm of uncertain behavior of skin: Secondary | ICD-10-CM | POA: Diagnosis not present

## 2020-04-08 DIAGNOSIS — L821 Other seborrheic keratosis: Secondary | ICD-10-CM | POA: Diagnosis not present

## 2020-04-08 DIAGNOSIS — D235 Other benign neoplasm of skin of trunk: Secondary | ICD-10-CM | POA: Diagnosis not present

## 2020-05-05 DIAGNOSIS — Z03818 Encounter for observation for suspected exposure to other biological agents ruled out: Secondary | ICD-10-CM | POA: Diagnosis not present

## 2020-05-08 DIAGNOSIS — R195 Other fecal abnormalities: Secondary | ICD-10-CM | POA: Diagnosis not present

## 2020-05-08 DIAGNOSIS — K635 Polyp of colon: Secondary | ICD-10-CM | POA: Diagnosis not present

## 2020-05-13 DIAGNOSIS — K635 Polyp of colon: Secondary | ICD-10-CM | POA: Diagnosis not present

## 2020-07-17 DIAGNOSIS — Z1231 Encounter for screening mammogram for malignant neoplasm of breast: Secondary | ICD-10-CM | POA: Diagnosis not present

## 2020-07-22 DIAGNOSIS — J309 Allergic rhinitis, unspecified: Secondary | ICD-10-CM | POA: Diagnosis not present

## 2020-07-22 DIAGNOSIS — Z1389 Encounter for screening for other disorder: Secondary | ICD-10-CM | POA: Diagnosis not present

## 2020-07-22 DIAGNOSIS — E039 Hypothyroidism, unspecified: Secondary | ICD-10-CM | POA: Diagnosis not present

## 2020-07-22 DIAGNOSIS — D3A019 Benign carcinoid tumor of the small intestine, unspecified portion: Secondary | ICD-10-CM | POA: Diagnosis not present

## 2020-07-22 DIAGNOSIS — Z0000 Encounter for general adult medical examination without abnormal findings: Secondary | ICD-10-CM | POA: Diagnosis not present

## 2020-07-22 DIAGNOSIS — E042 Nontoxic multinodular goiter: Secondary | ICD-10-CM | POA: Diagnosis not present

## 2020-07-22 DIAGNOSIS — I872 Venous insufficiency (chronic) (peripheral): Secondary | ICD-10-CM | POA: Diagnosis not present

## 2020-07-22 DIAGNOSIS — E7800 Pure hypercholesterolemia, unspecified: Secondary | ICD-10-CM | POA: Diagnosis not present

## 2020-07-22 DIAGNOSIS — I10 Essential (primary) hypertension: Secondary | ICD-10-CM | POA: Diagnosis not present

## 2020-07-24 DIAGNOSIS — Z20828 Contact with and (suspected) exposure to other viral communicable diseases: Secondary | ICD-10-CM | POA: Diagnosis not present

## 2020-09-03 DIAGNOSIS — Z23 Encounter for immunization: Secondary | ICD-10-CM | POA: Diagnosis not present

## 2020-09-03 DIAGNOSIS — Z7189 Other specified counseling: Secondary | ICD-10-CM | POA: Diagnosis not present

## 2020-09-24 DIAGNOSIS — Z7189 Other specified counseling: Secondary | ICD-10-CM | POA: Diagnosis not present

## 2020-09-24 DIAGNOSIS — Z23 Encounter for immunization: Secondary | ICD-10-CM | POA: Diagnosis not present

## 2021-01-16 ENCOUNTER — Other Ambulatory Visit: Payer: Self-pay

## 2021-01-16 ENCOUNTER — Inpatient Hospital Stay: Payer: Medicare PPO | Attending: Hematology and Oncology

## 2021-01-16 DIAGNOSIS — Z85060 Personal history of malignant carcinoid tumor of small intestine: Secondary | ICD-10-CM | POA: Diagnosis not present

## 2021-01-16 DIAGNOSIS — C7A019 Malignant carcinoid tumor of the small intestine, unspecified portion: Secondary | ICD-10-CM

## 2021-01-16 LAB — CBC WITH DIFFERENTIAL (CANCER CENTER ONLY)
Abs Immature Granulocytes: 0.02 10*3/uL (ref 0.00–0.07)
Basophils Absolute: 0.1 10*3/uL (ref 0.0–0.1)
Basophils Relative: 1 %
Eosinophils Absolute: 0.1 10*3/uL (ref 0.0–0.5)
Eosinophils Relative: 2 %
HCT: 46.6 % — ABNORMAL HIGH (ref 36.0–46.0)
Hemoglobin: 15 g/dL (ref 12.0–15.0)
Immature Granulocytes: 0 %
Lymphocytes Relative: 39 %
Lymphs Abs: 2.6 10*3/uL (ref 0.7–4.0)
MCH: 30 pg (ref 26.0–34.0)
MCHC: 32.2 g/dL (ref 30.0–36.0)
MCV: 93.2 fL (ref 80.0–100.0)
Monocytes Absolute: 0.5 10*3/uL (ref 0.1–1.0)
Monocytes Relative: 8 %
Neutro Abs: 3.4 10*3/uL (ref 1.7–7.7)
Neutrophils Relative %: 50 %
Platelet Count: 402 10*3/uL — ABNORMAL HIGH (ref 150–400)
RBC: 5 MIL/uL (ref 3.87–5.11)
RDW: 12 % (ref 11.5–15.5)
WBC Count: 6.7 10*3/uL (ref 4.0–10.5)
nRBC: 0 % (ref 0.0–0.2)

## 2021-01-16 LAB — CMP (CANCER CENTER ONLY)
ALT: 23 U/L (ref 0–44)
AST: 25 U/L (ref 15–41)
Albumin: 4.1 g/dL (ref 3.5–5.0)
Alkaline Phosphatase: 72 U/L (ref 38–126)
Anion gap: 10 (ref 5–15)
BUN: 14 mg/dL (ref 8–23)
CO2: 29 mmol/L (ref 22–32)
Calcium: 9.9 mg/dL (ref 8.9–10.3)
Chloride: 102 mmol/L (ref 98–111)
Creatinine: 0.96 mg/dL (ref 0.44–1.00)
GFR, Estimated: >60 mL/min (ref >60)
Glucose, Bld: 95 mg/dL (ref 70–99)
Potassium: 4.3 mmol/L (ref 3.5–5.1)
Sodium: 141 mmol/L (ref 135–145)
Total Bilirubin: 0.8 mg/dL (ref 0.3–1.2)
Total Protein: 7.8 g/dL (ref 6.5–8.1)

## 2021-01-21 LAB — CHROMOGRANIN A: Chromogranin A (ng/mL): 100.4 ng/mL (ref 0.0–101.8)

## 2021-01-22 DIAGNOSIS — E7800 Pure hypercholesterolemia, unspecified: Secondary | ICD-10-CM | POA: Diagnosis not present

## 2021-01-22 DIAGNOSIS — R609 Edema, unspecified: Secondary | ICD-10-CM | POA: Diagnosis not present

## 2021-01-22 DIAGNOSIS — I10 Essential (primary) hypertension: Secondary | ICD-10-CM | POA: Diagnosis not present

## 2021-01-22 DIAGNOSIS — E039 Hypothyroidism, unspecified: Secondary | ICD-10-CM | POA: Diagnosis not present

## 2021-01-22 NOTE — Progress Notes (Signed)
Patient Care Team: Carol Ada, MD as PCP - General (Family Medicine)  DIAGNOSIS:    ICD-10-CM   1. Malignant carcinoid tumor of small intestine, unspecified location ([place])  C7A.019     CHIEF COMPLIANT: Follow-up of carcinoid tumor of the small intestine  INTERVAL HISTORY: Anne Stark is a 70 y.o. with above-mentioned history of carcinoid tumor of the small intestine.She presents to the clinictoday for annual follow-up.   She does not have any abdominal pain or discomfort.  Denies any cramps or diarrhea.  Has not lost any weight.  ALLERGIES:  has No Known Allergies.  MEDICATIONS:  Current Outpatient Medications  Medication Sig Dispense Refill  . albuterol (PROVENTIL HFA;VENTOLIN HFA) 108 (90 BASE) MCG/ACT inhaler Inhale 2 puffs into the lungs every 6 (six) hours as needed. For shortness of breath or wheeze    . atorvastatin (LIPITOR) 10 MG tablet Take 10 mg by mouth daily.    Marland Kitchen azelastine (ASTELIN) 137 MCG/SPRAY nasal spray Place 1 spray into the nose 2 (two) times daily as needed for rhinitis or allergies. Use in each nostril as directed    . furosemide (LASIX) 20 MG tablet Take 20 mg by mouth daily.    . Glucosamine Sulfate 1000 MG CAPS Take 1,000 mg by mouth daily.    Marland Kitchen levothyroxine (SYNTHROID, LEVOTHROID) 50 MCG tablet Take 50 mcg by mouth daily before breakfast.    . lisinopril (PRINIVIL,ZESTRIL) 5 MG tablet Take 1 tablet (5 mg total) by mouth daily.    Marland Kitchen loratadine (CLARITIN) 10 MG tablet Take 10 mg by mouth daily.    . Multiple Vitamin (MULTIVITAMIN WITH MINERALS) TABS Take 1 tablet by mouth daily.    . potassium chloride (K-DUR,KLOR-CON) 10 MEQ tablet Take 10 mEq by mouth daily.     No current facility-administered medications for this visit.    PHYSICAL EXAMINATION: ECOG PERFORMANCE STATUS: 1 - Symptomatic but completely ambulatory  Vitals:   01/23/21 0852  BP: (!) 147/67  Pulse: 74  Resp: 18  Temp: 97.9 F (36.6 C)  SpO2: 100%   Filed Weights    01/23/21 0852  Weight: 151 lb 4.8 oz (68.6 kg)     LABORATORY DATA:  I have reviewed the data as listed CMP Latest Ref Rng & Units 01/16/2021 01/22/2020 01/17/2019  Glucose 70 - 99 mg/dL 95 99 90  BUN 8 - 23 mg/dL 14 16 17   Creatinine 0.44 - 1.00 mg/dL 0.96 0.90 0.98  Sodium 135 - 145 mmol/L 141 144 142  Potassium 3.5 - 5.1 mmol/L 4.3 4.5 4.3  Chloride 98 - 111 mmol/L 102 106 103  CO2 22 - 32 mmol/L 29 28 31   Calcium 8.9 - 10.3 mg/dL 9.9 9.4 9.7  Total Protein 6.5 - 8.1 g/dL 7.8 7.4 7.5  Total Bilirubin 0.3 - 1.2 mg/dL 0.8 0.7 0.6  Alkaline Phos 38 - 126 U/L 72 65 77  AST 15 - 41 U/L 25 25 25   ALT 0 - 44 U/L 23 23 18     Lab Results  Component Value Date   WBC 6.7 01/16/2021   HGB 15.0 01/16/2021   HCT 46.6 (H) 01/16/2021   MCV 93.2 01/16/2021   PLT 402 (H) 01/16/2021   NEUTROABS 3.4 01/16/2021    ASSESSMENT & PLAN:  Carcinoid tumor of small intestine Well-differentiated neuroendocrine tumor, carcinoid with 1/3 positive lymph node and serosal invasion Dr. Rosendo Gros who performed small bowel resection with anastomosis on 07/27/2016.   Blood work 01/22/2020: CBC CMP and normal. Chromogranin A:100 (  normal)  Plan: Surveillance 01/17/2019:CT abd and Pelvis: No evidence of tumor recurrence. 3 mm RLL lung nodule probably benign 01/22/2020: CT abdomen pelvis: No signs of disease recurrence.  Signs of mild bowel resection, moderate hiatal hernia  We plan to do CTs and an as-needed basis in the future. 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  Return to clinic in 1 year for follow-up.  No orders of the defined types were placed in this encounter.  The patient has a good understanding of the overall plan. she agrees with it. she will call with any problems that may develop before the next visit here.  Total time spent: 20 mins including face to face time and time spent for planning, charting and coordination of care  Rulon Eisenmenger, MD, MPH 01/23/2021  I, Molly Dorshimer, am acting as scribe for Dr. Nicholas Lose.  I have reviewed the above documentation for accuracy and completeness, and I agree with the above.

## 2021-01-22 NOTE — Assessment & Plan Note (Signed)
Well-differentiated neuroendocrine tumor, carcinoid with 1/3 positive lymph node and serosal invasion Dr. Rosendo Gros who performed small bowel resection with anastomosis on 07/27/2016.   Blood work 01/22/2020: CBC CMP and normal. Chromogranin A:83.7 (normal)  Plan: Surveillance 01/17/2019:CT abd and Pelvis: No evidence of tumor recurrence. 3 mm RLL lung nodule probably benign 01/22/2020: CT abdomen pelvis: No signs of disease recurrence.  Signs of mild bowel resection, moderate hiatal hernia  We plan to do CTs and an as-needed basis in the future. 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  Return to clinic in 1 year for follow-up.

## 2021-01-23 ENCOUNTER — Telehealth: Payer: Self-pay | Admitting: Hematology and Oncology

## 2021-01-23 ENCOUNTER — Other Ambulatory Visit: Payer: Self-pay

## 2021-01-23 ENCOUNTER — Inpatient Hospital Stay: Payer: Medicare PPO | Admitting: Hematology and Oncology

## 2021-01-23 DIAGNOSIS — C7A019 Malignant carcinoid tumor of the small intestine, unspecified portion: Secondary | ICD-10-CM

## 2021-01-23 DIAGNOSIS — Z85060 Personal history of malignant carcinoid tumor of small intestine: Secondary | ICD-10-CM | POA: Diagnosis not present

## 2021-01-23 NOTE — Telephone Encounter (Signed)
Scheduled appts per 2/25 los. Gave pt a print out of AVS.

## 2021-07-20 DIAGNOSIS — Z1231 Encounter for screening mammogram for malignant neoplasm of breast: Secondary | ICD-10-CM | POA: Diagnosis not present

## 2021-07-23 DIAGNOSIS — L821 Other seborrheic keratosis: Secondary | ICD-10-CM | POA: Diagnosis not present

## 2021-07-23 DIAGNOSIS — L2089 Other atopic dermatitis: Secondary | ICD-10-CM | POA: Diagnosis not present

## 2021-07-23 DIAGNOSIS — L570 Actinic keratosis: Secondary | ICD-10-CM | POA: Diagnosis not present

## 2021-08-05 DIAGNOSIS — Z1389 Encounter for screening for other disorder: Secondary | ICD-10-CM | POA: Diagnosis not present

## 2021-08-05 DIAGNOSIS — E7800 Pure hypercholesterolemia, unspecified: Secondary | ICD-10-CM | POA: Diagnosis not present

## 2021-08-05 DIAGNOSIS — I10 Essential (primary) hypertension: Secondary | ICD-10-CM | POA: Diagnosis not present

## 2021-08-05 DIAGNOSIS — Z23 Encounter for immunization: Secondary | ICD-10-CM | POA: Diagnosis not present

## 2021-08-05 DIAGNOSIS — E039 Hypothyroidism, unspecified: Secondary | ICD-10-CM | POA: Diagnosis not present

## 2021-08-05 DIAGNOSIS — Z1159 Encounter for screening for other viral diseases: Secondary | ICD-10-CM | POA: Diagnosis not present

## 2021-08-05 DIAGNOSIS — Z0000 Encounter for general adult medical examination without abnormal findings: Secondary | ICD-10-CM | POA: Diagnosis not present

## 2021-08-08 DIAGNOSIS — J45909 Unspecified asthma, uncomplicated: Secondary | ICD-10-CM | POA: Diagnosis not present

## 2021-08-08 DIAGNOSIS — E039 Hypothyroidism, unspecified: Secondary | ICD-10-CM | POA: Diagnosis not present

## 2021-08-08 DIAGNOSIS — Z803 Family history of malignant neoplasm of breast: Secondary | ICD-10-CM | POA: Diagnosis not present

## 2021-08-08 DIAGNOSIS — Z811 Family history of alcohol abuse and dependence: Secondary | ICD-10-CM | POA: Diagnosis not present

## 2021-08-08 DIAGNOSIS — Z8249 Family history of ischemic heart disease and other diseases of the circulatory system: Secondary | ICD-10-CM | POA: Diagnosis not present

## 2021-08-08 DIAGNOSIS — Z818 Family history of other mental and behavioral disorders: Secondary | ICD-10-CM | POA: Diagnosis not present

## 2021-08-08 DIAGNOSIS — R609 Edema, unspecified: Secondary | ICD-10-CM | POA: Diagnosis not present

## 2021-08-08 DIAGNOSIS — E785 Hyperlipidemia, unspecified: Secondary | ICD-10-CM | POA: Diagnosis not present

## 2021-08-08 DIAGNOSIS — I10 Essential (primary) hypertension: Secondary | ICD-10-CM | POA: Diagnosis not present

## 2021-12-09 DIAGNOSIS — J069 Acute upper respiratory infection, unspecified: Secondary | ICD-10-CM | POA: Diagnosis not present

## 2021-12-09 DIAGNOSIS — Z20822 Contact with and (suspected) exposure to covid-19: Secondary | ICD-10-CM | POA: Diagnosis not present

## 2022-01-18 ENCOUNTER — Other Ambulatory Visit: Payer: Medicare PPO

## 2022-01-25 ENCOUNTER — Ambulatory Visit: Payer: Medicare PPO | Admitting: Hematology and Oncology

## 2022-01-26 ENCOUNTER — Ambulatory Visit: Payer: Medicare PPO | Admitting: Hematology and Oncology

## 2022-02-03 DIAGNOSIS — E7800 Pure hypercholesterolemia, unspecified: Secondary | ICD-10-CM | POA: Diagnosis not present

## 2022-02-03 DIAGNOSIS — E039 Hypothyroidism, unspecified: Secondary | ICD-10-CM | POA: Diagnosis not present

## 2022-02-03 DIAGNOSIS — I10 Essential (primary) hypertension: Secondary | ICD-10-CM | POA: Diagnosis not present

## 2022-02-05 DIAGNOSIS — J45909 Unspecified asthma, uncomplicated: Secondary | ICD-10-CM | POA: Diagnosis not present

## 2022-02-05 DIAGNOSIS — Z8249 Family history of ischemic heart disease and other diseases of the circulatory system: Secondary | ICD-10-CM | POA: Diagnosis not present

## 2022-02-05 DIAGNOSIS — Z825 Family history of asthma and other chronic lower respiratory diseases: Secondary | ICD-10-CM | POA: Diagnosis not present

## 2022-02-05 DIAGNOSIS — Z803 Family history of malignant neoplasm of breast: Secondary | ICD-10-CM | POA: Diagnosis not present

## 2022-02-05 DIAGNOSIS — M1990 Unspecified osteoarthritis, unspecified site: Secondary | ICD-10-CM | POA: Diagnosis not present

## 2022-02-05 DIAGNOSIS — I10 Essential (primary) hypertension: Secondary | ICD-10-CM | POA: Diagnosis not present

## 2022-02-05 DIAGNOSIS — R609 Edema, unspecified: Secondary | ICD-10-CM | POA: Diagnosis not present

## 2022-02-05 DIAGNOSIS — E039 Hypothyroidism, unspecified: Secondary | ICD-10-CM | POA: Diagnosis not present

## 2022-02-05 DIAGNOSIS — E785 Hyperlipidemia, unspecified: Secondary | ICD-10-CM | POA: Diagnosis not present

## 2022-03-11 NOTE — Progress Notes (Signed)
? ?Patient Care Team: ?Carol Ada, MD as PCP - General (Family Medicine) ? ?DIAGNOSIS:  ?Encounter Diagnoses  ?Name Primary?  ? Malignant carcinoid tumor of small intestine, unspecified location [facility])   ? Liver disease Yes  ? ? ?CHIEF COMPLIANT: Follow-up of carcinoid tumor of the small intestine  ? ?INTERVAL HISTORY: Anne Stark is a 71 y.o. with above-mentioned history of carcinoid tumor of the small intestine. She presents to the clinic today for annual follow-up. States that she has no belly pain issues. Denies constipation and diarrhea. States that she is doing well. She states that she had a little diarrhea but she think from food poison. ?She denies any sweats or flushing sensation. ? ? ?ALLERGIES:  has No Known Allergies. ? ?MEDICATIONS:  ?Current Outpatient Medications  ?Medication Sig Dispense Refill  ? albuterol (PROVENTIL HFA;VENTOLIN HFA) 108 (90 BASE) MCG/ACT inhaler Inhale 2 puffs into the lungs every 6 (six) hours as needed. For shortness of breath or wheeze    ? atorvastatin (LIPITOR) 10 MG tablet Take 10 mg by mouth daily.    ? azelastine (ASTELIN) 137 MCG/SPRAY nasal spray Place 1 spray into the nose 2 (two) times daily as needed for rhinitis or allergies. Use in each nostril as directed    ? furosemide (LASIX) 20 MG tablet Take 20 mg by mouth daily.    ? Glucosamine Sulfate 1000 MG CAPS Take 1,000 mg by mouth daily.    ? levothyroxine (SYNTHROID, LEVOTHROID) 50 MCG tablet Take 50 mcg by mouth daily before breakfast.    ? lisinopril (PRINIVIL,ZESTRIL) 5 MG tablet Take 1 tablet (5 mg total) by mouth daily.    ? loratadine (CLARITIN) 10 MG tablet Take 10 mg by mouth daily.    ? Multiple Vitamin (MULTIVITAMIN WITH MINERALS) TABS Take 1 tablet by mouth daily.    ? potassium chloride (K-DUR,KLOR-CON) 10 MEQ tablet Take 10 mEq by mouth daily.    ? ?No current facility-administered medications for this visit.  ? ? ?PHYSICAL EXAMINATION: ?ECOG PERFORMANCE STATUS: 1 - Symptomatic but completely  ambulatory ? ?Vitals:  ? 03/25/22 1021  ?BP: (!) 166/87  ?Pulse: 70  ?Resp: 18  ?Temp: 97.9 ?F (36.6 ?C)  ?SpO2: 100%  ? ?Filed Weights  ? 03/25/22 1021  ?Weight: 149 lb 4.8 oz (67.7 kg)  ? ?  ? ?LABORATORY DATA:  ?I have reviewed the data as listed ? ?  Latest Ref Rng & Units 03/18/2022  ? 10:03 AM 01/16/2021  ? 10:45 AM 01/22/2020  ?  8:47 AM  ?CMP  ?Glucose 70 - 99 mg/dL 92   95   99    ?BUN 8 - 23 mg/dL '17   14   16    '$ ?Creatinine 0.44 - 1.00 mg/dL 0.89   0.96   0.90    ?Sodium 135 - 145 mmol/L 142   141   144    ?Potassium 3.5 - 5.1 mmol/L 4.1   4.3   4.5    ?Chloride 98 - 111 mmol/L 104   102   106    ?CO2 22 - 32 mmol/L '30   29   28    '$ ?Calcium 8.9 - 10.3 mg/dL 9.6   9.9   9.4    ?Total Protein 6.5 - 8.1 g/dL 7.1   7.8   7.4    ?Total Bilirubin 0.3 - 1.2 mg/dL 0.5   0.8   0.7    ?Alkaline Phos 38 - 126 U/L 62   72  65    ?AST 15 - 41 U/L '22   25   25    '$ ?ALT 0 - 44 U/L '17   23   23    '$ ? ? ?Lab Results  ?Component Value Date  ? WBC 6.4 03/18/2022  ? HGB 14.5 03/18/2022  ? HCT 44.7 03/18/2022  ? MCV 91.4 03/18/2022  ? PLT 366 03/18/2022  ? NEUTROABS 2.8 03/18/2022  ? ? ?ASSESSMENT & PLAN:  ?Carcinoid tumor of small intestine ?Well-differentiated neuroendocrine tumor, carcinoid with 1/3 positive lymph node and serosal invasion ?Dr. Rosendo Gros who performed small bowel resection with anastomosis on 07/27/2016.  ?  ?Blood work   ?01/22/2020: CBC CMP and normal.Chromogranin A : 100 (normal) ?03/18/2022: CBC CMP normal, chromogranin A 122.9 ?With the increase in the chromogranin a levels, I recommended that we obtain a PET CT scan.  (NET-SPOT) ?Telephone visit 1 week after to discuss results ?  ?Plan: Surveillance ?01/17/2019: CT abd and Pelvis: No evidence of tumor recurrence. 3 mm RLL lung nodule probably benign ?01/22/2020: CT abdomen pelvis: No signs of disease recurrence.  Signs of mild bowel resection, moderate hiatal hernia ?   ?  ?Return to clinic in 1 year for follow-up. ?  ? ? ? ?Orders Placed This Encounter   ?Procedures  ? NM PET (NETSPOT GA 31 DOTATATE) SKULL BASE TO MID THIGH  ?  Standing Status:   Future  ?  Standing Expiration Date:   03/26/2023  ?  Order Specific Question:   If indicated for the ordered procedure, I authorize the administration of a radiopharmaceutical per Radiology protocol  ?  Answer:   Yes  ?  Order Specific Question:   Preferred imaging location?  ?  Answer:   [facility]  ?  Order Specific Question:   Release to patient  ?  Answer:   Immediate  ? CBC with Differential ([place] Only)  ?  Standing Status:   Future  ?  Standing Expiration Date:   03/26/2023  ? CMP ([place] [facility] only)  ?  Standing Status:   Future  ?  Standing Expiration Date:   03/26/2023  ? Chromogranin A  ?  Standing Status:   Future  ?  Standing Expiration Date:   03/25/2023  ? ?The patient has a good understanding of the overall plan. she agrees with it. she will call with any problems that may develop before the next visit here. ?Total time spent: 30 mins including face to face time and time spent for planning, charting and co-ordination of care ? ? Harriette Ohara, MD ?03/25/22 ? ? ?I Gardiner Coins am scribing for Dr. Lindi Adie ? ?I have reviewed the above documentation for accuracy and completeness, and I agree with the above. ?. ?

## 2022-03-13 DIAGNOSIS — R1084 Generalized abdominal pain: Secondary | ICD-10-CM | POA: Diagnosis not present

## 2022-03-13 DIAGNOSIS — I10 Essential (primary) hypertension: Secondary | ICD-10-CM | POA: Diagnosis not present

## 2022-03-13 DIAGNOSIS — Z79899 Other long term (current) drug therapy: Secondary | ICD-10-CM | POA: Diagnosis not present

## 2022-03-13 DIAGNOSIS — R197 Diarrhea, unspecified: Secondary | ICD-10-CM | POA: Diagnosis not present

## 2022-03-13 DIAGNOSIS — E079 Disorder of thyroid, unspecified: Secondary | ICD-10-CM | POA: Diagnosis not present

## 2022-03-13 DIAGNOSIS — R1111 Vomiting without nausea: Secondary | ICD-10-CM | POA: Diagnosis not present

## 2022-03-13 DIAGNOSIS — R1110 Vomiting, unspecified: Secondary | ICD-10-CM | POA: Diagnosis not present

## 2022-03-13 DIAGNOSIS — R1032 Left lower quadrant pain: Secondary | ICD-10-CM | POA: Diagnosis not present

## 2022-03-14 DIAGNOSIS — R197 Diarrhea, unspecified: Secondary | ICD-10-CM | POA: Diagnosis not present

## 2022-03-14 DIAGNOSIS — R1110 Vomiting, unspecified: Secondary | ICD-10-CM | POA: Diagnosis not present

## 2022-03-16 ENCOUNTER — Other Ambulatory Visit: Payer: Self-pay

## 2022-03-16 DIAGNOSIS — C7A019 Malignant carcinoid tumor of the small intestine, unspecified portion: Secondary | ICD-10-CM

## 2022-03-18 ENCOUNTER — Inpatient Hospital Stay: Payer: Medicare PPO | Attending: Hematology and Oncology

## 2022-03-18 ENCOUNTER — Other Ambulatory Visit: Payer: Self-pay

## 2022-03-18 DIAGNOSIS — C7A019 Malignant carcinoid tumor of the small intestine, unspecified portion: Secondary | ICD-10-CM | POA: Diagnosis not present

## 2022-03-18 DIAGNOSIS — Z79899 Other long term (current) drug therapy: Secondary | ICD-10-CM | POA: Insufficient documentation

## 2022-03-18 LAB — CBC WITH DIFFERENTIAL (CANCER CENTER ONLY)
Abs Immature Granulocytes: 0.01 10*3/uL (ref 0.00–0.07)
Basophils Absolute: 0 10*3/uL (ref 0.0–0.1)
Basophils Relative: 1 %
Eosinophils Absolute: 0.2 10*3/uL (ref 0.0–0.5)
Eosinophils Relative: 3 %
HCT: 44.7 % (ref 36.0–46.0)
Hemoglobin: 14.5 g/dL (ref 12.0–15.0)
Immature Granulocytes: 0 %
Lymphocytes Relative: 43 %
Lymphs Abs: 2.8 10*3/uL (ref 0.7–4.0)
MCH: 29.7 pg (ref 26.0–34.0)
MCHC: 32.4 g/dL (ref 30.0–36.0)
MCV: 91.4 fL (ref 80.0–100.0)
Monocytes Absolute: 0.6 10*3/uL (ref 0.1–1.0)
Monocytes Relative: 10 %
Neutro Abs: 2.8 10*3/uL (ref 1.7–7.7)
Neutrophils Relative %: 43 %
Platelet Count: 366 10*3/uL (ref 150–400)
RBC: 4.89 MIL/uL (ref 3.87–5.11)
RDW: 12.3 % (ref 11.5–15.5)
WBC Count: 6.4 10*3/uL (ref 4.0–10.5)
nRBC: 0 % (ref 0.0–0.2)

## 2022-03-18 LAB — CMP (CANCER CENTER ONLY)
ALT: 17 U/L (ref 0–44)
AST: 22 U/L (ref 15–41)
Albumin: 4 g/dL (ref 3.5–5.0)
Alkaline Phosphatase: 62 U/L (ref 38–126)
Anion gap: 8 (ref 5–15)
BUN: 17 mg/dL (ref 8–23)
CO2: 30 mmol/L (ref 22–32)
Calcium: 9.6 mg/dL (ref 8.9–10.3)
Chloride: 104 mmol/L (ref 98–111)
Creatinine: 0.89 mg/dL (ref 0.44–1.00)
GFR, Estimated: >60 mL/min (ref >60)
Glucose, Bld: 92 mg/dL (ref 70–99)
Potassium: 4.1 mmol/L (ref 3.5–5.1)
Sodium: 142 mmol/L (ref 135–145)
Total Bilirubin: 0.5 mg/dL (ref 0.3–1.2)
Total Protein: 7.1 g/dL (ref 6.5–8.1)

## 2022-03-19 LAB — CHROMOGRANIN A: Chromogranin A (ng/mL): 122.9 ng/mL — ABNORMAL HIGH (ref 0.0–101.8)

## 2022-03-24 NOTE — Assessment & Plan Note (Signed)
Well-differentiated neuroendocrine tumor, carcinoid with 1/3 positive lymph node and serosal invasion ?Dr. Rosendo Gros who performed small bowel resection with anastomosis on 07/27/2016.  ?? ?Blood work?? ?01/22/2020: CBC CMP and normal.Chromogranin A?:?100?(normal) ?03/18/2022: CBC CMP normal, chromogranin A 122.9 ? ?? ?Plan: Surveillance ?01/17/2019:?CT abd and Pelvis: No evidence of tumor recurrence. 3 mm RLL lung nodule probably benign ?01/22/2020: CT abdomen pelvis: No signs of disease recurrence. ?Signs of mild bowel resection, moderate hiatal hernia ?? ?We plan to do CTs and an as-needed basis in the future. ?? ?Return to clinic in 1 year for follow-up. ?? ?

## 2022-03-25 ENCOUNTER — Inpatient Hospital Stay: Payer: Medicare PPO | Admitting: Hematology and Oncology

## 2022-03-25 ENCOUNTER — Other Ambulatory Visit: Payer: Self-pay

## 2022-03-25 VITALS — BP 166/87 | HR 70 | Temp 97.9°F | Resp 18 | Ht 60.0 in | Wt 149.3 lb

## 2022-03-25 DIAGNOSIS — Z79899 Other long term (current) drug therapy: Secondary | ICD-10-CM | POA: Diagnosis not present

## 2022-03-25 DIAGNOSIS — K769 Liver disease, unspecified: Secondary | ICD-10-CM | POA: Diagnosis not present

## 2022-03-25 DIAGNOSIS — C7A019 Malignant carcinoid tumor of the small intestine, unspecified portion: Secondary | ICD-10-CM

## 2022-03-26 ENCOUNTER — Telehealth: Payer: Self-pay | Admitting: Hematology and Oncology

## 2022-03-26 NOTE — Telephone Encounter (Signed)
Per 4/27 los called and spoke to pt about appointment pt confirmed appointment  ?

## 2022-03-29 NOTE — Progress Notes (Signed)
HEMATOLOGY-ONCOLOGY TELEPHONE VISIT PROGRESS NOTE ? ?I connected with Anne Stark on 04/12/22 at  8:45 AM EDT by telephone and verified that I am speaking with the correct person using two identifiers.  ?I discussed the limitations, risks, security and privacy concerns of performing an evaluation and management service by telephone and the availability of in person appointments.  ?I also discussed with the patient that there may be a patient responsible charge related to this service. The patient expressed understanding and agreed to proceed.  ? ?History of Present Illness:  Anne Stark is a 71 y.o. with above-mentioned history of carcinoid tumor of the small intestine. She presents to the clinic today for via telephone follow-up.  She reports no new problems or concerns. ?  ? ?REVIEW OF SYSTEMS:   ?Constitutional: Denies fevers, chills or abnormal weight loss ?All other systems were reviewed with the patient and are negative. ?Observations/Objective:  ? ?  ?Assessment Plan:  ?Carcinoid tumor of small intestine ?Well-differentiated neuroendocrine tumor, carcinoid with 1/3 positive lymph node and serosal invasion ?Dr. Rosendo Gros who performed small bowel resection with anastomosis on 07/27/2016.  ?  ?Blood work   ?01/22/2020: CBC CMP and normal.Chromogranin A : 100 (normal) ?03/18/2022: CBC CMP normal, chromogranin A 122.9 ?With the increase in the chromogranin a levels, we obtained a PET CT scan ([facility]) ?  ?Plan: Surveillance ?01/17/2019: CT abd and Pelvis: No evidence of tumor recurrence. 3 mm RLL lung nodule probably benign ?01/22/2020: CT abdomen pelvis: No signs of disease recurrence.  Signs of mild bowel resection, moderate hiatal hernia  ?04/08/2022: Negative PET [facility] scan. No findings for recurrent or metastatic carcinoid tumor. ?  ?Recheck chromogranin A in 3 months and follow-up after that with a telephone visit ? ? ?I discussed the assessment and treatment plan with the patient. The patient was provided an  opportunity to ask questions and all were answered. The patient agreed with the plan and demonstrated an understanding of the instructions. The patient was advised to call back or seek an in-person evaluation if the symptoms worsen or if the condition fails to improve as anticipated.  ? ?I provided 12 minutes of non-face-to-face time during this encounter. Harriette Ohara, MD   ?Earlie Server am scribing for Dr. Lindi Adie ? ?I have reviewed the above documentation for accuracy and completeness, and I agree with the above. ?  ?

## 2022-03-30 ENCOUNTER — Other Ambulatory Visit: Payer: Self-pay | Admitting: *Deleted

## 2022-03-30 DIAGNOSIS — K769 Liver disease, unspecified: Secondary | ICD-10-CM

## 2022-03-30 NOTE — Progress Notes (Signed)
Received call from [facility] stating they no longer preform PET [facility] GA 57.  States MD will need to order PET CU 51 [facility] as a similar study.  RN reviewed with MD and verbal orders received to proceed with ordering PET CU 64.  Verbal orders placed.  ?

## 2022-04-08 ENCOUNTER — Encounter (HOSPITAL_COMMUNITY)
Admission: RE | Admit: 2022-04-08 | Discharge: 2022-04-08 | Disposition: A | Discharge status: Home / Self Care | Payer: Medicare PPO | Source: Ambulatory Visit | Attending: Hematology and Oncology | Admitting: Hematology and Oncology

## 2022-04-08 DIAGNOSIS — Z9049 Acquired absence of other specified parts of digestive tract: Secondary | ICD-10-CM | POA: Diagnosis not present

## 2022-04-08 DIAGNOSIS — K769 Liver disease, unspecified: Secondary | ICD-10-CM | POA: Diagnosis not present

## 2022-04-08 DIAGNOSIS — Z85060 Personal history of malignant carcinoid tumor of small intestine: Secondary | ICD-10-CM | POA: Insufficient documentation

## 2022-04-08 MED ORDER — FLUDEOXYGLUCOSE F - 18 (FDG) INJECTION: 4.1900 | Freq: Once | INTRAVENOUS | Status: DC | PRN

## 2022-04-08 MED ORDER — COPPER CU 64 DOTATATE 1 MCI/ML IV SOLN
4.0000 | Freq: Once | INTRAVENOUS | Status: AC
  Administered 2022-04-08: 4.19 via INTRAVENOUS

## 2022-04-12 ENCOUNTER — Inpatient Hospital Stay: Payer: Medicare PPO | Attending: Hematology and Oncology | Admitting: Hematology and Oncology

## 2022-04-12 DIAGNOSIS — C7A019 Malignant carcinoid tumor of the small intestine, unspecified portion: Secondary | ICD-10-CM | POA: Diagnosis not present

## 2022-04-12 NOTE — Assessment & Plan Note (Signed)
Well-differentiated neuroendocrine tumor, carcinoid with 1/3 positive lymph node and serosal invasion ?Dr. Rosendo Gros who performed small bowel resection with anastomosis on 07/27/2016.  ?? ?Blood work?? ?01/22/2020: CBC CMP and normal.Chromogranin A?:?100?(normal) ?03/18/2022: CBC CMP normal, chromogranin A 122.9 ?With the increase in the chromogranin a levels, we obtained a PET CT scan ([facility]) ?? ?Plan: Surveillance ?01/17/2019:?CT abd and Pelvis: No evidence of tumor recurrence. 3 mm RLL lung nodule probably benign ?01/22/2020: CT abdomen pelvis: No signs of disease recurrence. ?Signs of mild bowel resection, moderate hiatal hernia  ?04/08/2022: Negative PET [facility] scan. No findings for recurrent or metastatic carcinoid tumor. ?? ?Recheck chromogranin A in 3 months and follow-up after that with a telephone visit ?

## 2022-04-13 ENCOUNTER — Telehealth: Payer: Self-pay | Admitting: Hematology and Oncology

## 2022-04-13 NOTE — Telephone Encounter (Signed)
Scheduled appointment per 5/15 los. Patient is aware. ?

## 2022-07-15 ENCOUNTER — Other Ambulatory Visit: Payer: Self-pay

## 2022-07-15 ENCOUNTER — Inpatient Hospital Stay: Payer: Medicare PPO | Attending: Hematology and Oncology

## 2022-07-15 DIAGNOSIS — R978 Other abnormal tumor markers: Secondary | ICD-10-CM | POA: Diagnosis not present

## 2022-07-15 DIAGNOSIS — C7A019 Malignant carcinoid tumor of the small intestine, unspecified portion: Secondary | ICD-10-CM

## 2022-07-15 DIAGNOSIS — K769 Liver disease, unspecified: Secondary | ICD-10-CM

## 2022-07-15 LAB — CBC WITH DIFFERENTIAL (CANCER CENTER ONLY)
Abs Immature Granulocytes: 0.02 10*3/uL (ref 0.00–0.07)
Basophils Absolute: 0.1 10*3/uL (ref 0.0–0.1)
Basophils Relative: 1 %
Eosinophils Absolute: 0.2 10*3/uL (ref 0.0–0.5)
Eosinophils Relative: 4 %
HCT: 42.7 % (ref 36.0–46.0)
Hemoglobin: 14.6 g/dL (ref 12.0–15.0)
Immature Granulocytes: 0 %
Lymphocytes Relative: 39 %
Lymphs Abs: 2.4 10*3/uL (ref 0.7–4.0)
MCH: 30.7 pg (ref 26.0–34.0)
MCHC: 34.2 g/dL (ref 30.0–36.0)
MCV: 89.9 fL (ref 80.0–100.0)
Monocytes Absolute: 0.5 10*3/uL (ref 0.1–1.0)
Monocytes Relative: 8 %
Neutro Abs: 3 10*3/uL (ref 1.7–7.7)
Neutrophils Relative %: 48 %
Platelet Count: 366 10*3/uL (ref 150–400)
RBC: 4.75 MIL/uL (ref 3.87–5.11)
RDW: 12 % (ref 11.5–15.5)
WBC Count: 6.2 10*3/uL (ref 4.0–10.5)
nRBC: 0 % (ref 0.0–0.2)

## 2022-07-15 LAB — CMP (CANCER CENTER ONLY)
ALT: 20 U/L (ref 0–44)
AST: 22 U/L (ref 15–41)
Albumin: 4 g/dL (ref 3.5–5.0)
Alkaline Phosphatase: 56 U/L (ref 38–126)
Anion gap: 5 (ref 5–15)
BUN: 15 mg/dL (ref 8–23)
CO2: 29 mmol/L (ref 22–32)
Calcium: 9.4 mg/dL (ref 8.9–10.3)
Chloride: 106 mmol/L (ref 98–111)
Creatinine: 0.83 mg/dL (ref 0.44–1.00)
GFR, Estimated: >60 mL/min (ref >60)
Glucose, Bld: 91 mg/dL (ref 70–99)
Potassium: 4.2 mmol/L (ref 3.5–5.1)
Sodium: 140 mmol/L (ref 135–145)
Total Bilirubin: 0.7 mg/dL (ref 0.3–1.2)
Total Protein: 6.8 g/dL (ref 6.5–8.1)

## 2022-07-16 LAB — CHROMOGRANIN A: Chromogranin A (ng/mL): 71.8 ng/mL (ref 0.0–101.8)

## 2022-07-26 DIAGNOSIS — Z1231 Encounter for screening mammogram for malignant neoplasm of breast: Secondary | ICD-10-CM | POA: Diagnosis not present

## 2022-08-25 DIAGNOSIS — E039 Hypothyroidism, unspecified: Secondary | ICD-10-CM | POA: Diagnosis not present

## 2022-08-25 DIAGNOSIS — E7800 Pure hypercholesterolemia, unspecified: Secondary | ICD-10-CM | POA: Diagnosis not present

## 2022-08-25 DIAGNOSIS — M8580 Other specified disorders of bone density and structure, unspecified site: Secondary | ICD-10-CM | POA: Diagnosis not present

## 2022-08-25 DIAGNOSIS — Z0000 Encounter for general adult medical examination without abnormal findings: Secondary | ICD-10-CM | POA: Diagnosis not present

## 2022-08-25 DIAGNOSIS — Z1331 Encounter for screening for depression: Secondary | ICD-10-CM | POA: Diagnosis not present

## 2022-08-25 DIAGNOSIS — I10 Essential (primary) hypertension: Secondary | ICD-10-CM | POA: Diagnosis not present

## 2022-08-25 DIAGNOSIS — Z23 Encounter for immunization: Secondary | ICD-10-CM | POA: Diagnosis not present

## 2022-09-09 DIAGNOSIS — Z780 Asymptomatic menopausal state: Secondary | ICD-10-CM | POA: Diagnosis not present

## 2022-09-09 DIAGNOSIS — M8589 Other specified disorders of bone density and structure, multiple sites: Secondary | ICD-10-CM | POA: Diagnosis not present

## 2022-09-28 DIAGNOSIS — M8580 Other specified disorders of bone density and structure, unspecified site: Secondary | ICD-10-CM | POA: Diagnosis not present

## 2022-10-14 ENCOUNTER — Other Ambulatory Visit: Payer: Self-pay | Admitting: *Deleted

## 2022-10-14 DIAGNOSIS — C7A019 Malignant carcinoid tumor of the small intestine, unspecified portion: Secondary | ICD-10-CM

## 2022-10-14 DIAGNOSIS — K769 Liver disease, unspecified: Secondary | ICD-10-CM

## 2022-10-15 ENCOUNTER — Inpatient Hospital Stay: Payer: Medicare PPO | Attending: Hematology and Oncology

## 2022-10-15 DIAGNOSIS — R911 Solitary pulmonary nodule: Secondary | ICD-10-CM | POA: Diagnosis not present

## 2022-10-15 DIAGNOSIS — Z79899 Other long term (current) drug therapy: Secondary | ICD-10-CM | POA: Insufficient documentation

## 2022-10-15 DIAGNOSIS — C7A019 Malignant carcinoid tumor of the small intestine, unspecified portion: Secondary | ICD-10-CM | POA: Insufficient documentation

## 2022-10-15 DIAGNOSIS — K769 Liver disease, unspecified: Secondary | ICD-10-CM

## 2022-10-15 DIAGNOSIS — R978 Other abnormal tumor markers: Secondary | ICD-10-CM | POA: Insufficient documentation

## 2022-10-15 LAB — CBC WITH DIFFERENTIAL (CANCER CENTER ONLY)
Abs Immature Granulocytes: 0.01 10*3/uL (ref 0.00–0.07)
Basophils Absolute: 0 10*3/uL (ref 0.0–0.1)
Basophils Relative: 1 %
Eosinophils Absolute: 0.2 10*3/uL (ref 0.0–0.5)
Eosinophils Relative: 3 %
HCT: 43.2 % (ref 36.0–46.0)
Hemoglobin: 14.3 g/dL (ref 12.0–15.0)
Immature Granulocytes: 0 %
Lymphocytes Relative: 40 %
Lymphs Abs: 2.2 10*3/uL (ref 0.7–4.0)
MCH: 30.8 pg (ref 26.0–34.0)
MCHC: 33.1 g/dL (ref 30.0–36.0)
MCV: 93.1 fL (ref 80.0–100.0)
Monocytes Absolute: 0.5 10*3/uL (ref 0.1–1.0)
Monocytes Relative: 9 %
Neutro Abs: 2.7 10*3/uL (ref 1.7–7.7)
Neutrophils Relative %: 47 %
Platelet Count: 336 10*3/uL (ref 150–400)
RBC: 4.64 MIL/uL (ref 3.87–5.11)
RDW: 12.4 % (ref 11.5–15.5)
WBC Count: 5.7 10*3/uL (ref 4.0–10.5)
nRBC: 0 % (ref 0.0–0.2)

## 2022-10-15 LAB — CMP (CANCER CENTER ONLY)
ALT: 21 U/L (ref 0–44)
AST: 22 U/L (ref 15–41)
Albumin: 4.1 g/dL (ref 3.5–5.0)
Alkaline Phosphatase: 55 U/L (ref 38–126)
Anion gap: 4 — ABNORMAL LOW (ref 5–15)
BUN: 16 mg/dL (ref 8–23)
CO2: 31 mmol/L (ref 22–32)
Calcium: 9.4 mg/dL (ref 8.9–10.3)
Chloride: 106 mmol/L (ref 98–111)
Creatinine: 0.84 mg/dL (ref 0.44–1.00)
GFR, Estimated: >60 mL/min (ref >60)
Glucose, Bld: 93 mg/dL (ref 70–99)
Potassium: 4.3 mmol/L (ref 3.5–5.1)
Sodium: 141 mmol/L (ref 135–145)
Total Bilirubin: 0.5 mg/dL (ref 0.3–1.2)
Total Protein: 6.9 g/dL (ref 6.5–8.1)

## 2022-10-18 LAB — CHROMOGRANIN A: Chromogranin A (ng/mL): 93.8 ng/mL (ref 0.0–101.8)

## 2022-10-22 NOTE — Progress Notes (Signed)
HEMATOLOGY-ONCOLOGY TELEPHONE VISIT PROGRESS NOTE  I connected with our patient on 10/26/22 at  8:45 AM EST by telephone and verified that I am speaking with the correct person using two identifiers.  I discussed the limitations, risks, security and privacy concerns of performing an evaluation and management service by telephone and the availability of in person appointments.  I also discussed with the patient that there may be a patient responsible charge related to this service. The patient expressed understanding and agreed to proceed.   History of Present Illness: Anne Stark is a 71 y.o with a history of carcinoid tumor of the small intestine. She presents to the clinic via phone visit.  Patient does not report any new problems or concerns the belly.  REVIEW OF SYSTEMS:   Constitutional: Denies fevers, chills or abnormal weight loss All other systems were reviewed with the patient and are negative. 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 Rosendo Gros who performed small bowel resection with anastomosis on 07/27/2016.    Blood work   01/22/2020: CBC CMP and normal.Chromogranin A : 100 (normal) 03/18/2022: CBC CMP normal, chromogranin A 122.9 With the increase in the chromogranin a levels, we obtained a PET CT scan ([facility]): Benign 10/15/2022: CBC CMP: Normal chromogranin A 93.8   Plan: Surveillance 01/17/2019: CT abd and Pelvis: No evidence of tumor recurrence. 3 mm RLL lung nodule probably benign 01/22/2020: CT abdomen pelvis: No signs of disease recurrence.  Signs of mild bowel resection, moderate hiatal hernia  04/08/2022: Negative PET [facility] scan. No findings for recurrent or metastatic carcinoid tumor.   Recheck chromogranin A in 3 months and follow-up after that with a telephone visit    I discussed the assessment and treatment plan  with the patient. The patient was provided an opportunity to ask questions and all were answered. The patient agreed with the plan and demonstrated an understanding of the instructions. The patient was advised to call back or seek an in-person evaluation if the symptoms worsen or if the condition fails to improve as anticipated.   I provided 12 minutes of non-face-to-face time during this encounter.  This includes time for charting and coordination of care   Harriette Ohara, MD  I Gardiner Coins am scribing for Dr. Lindi Adie  I have reviewed the above documentation for accuracy and completeness, and I agree with the above.

## 2022-10-26 ENCOUNTER — Inpatient Hospital Stay (HOSPITAL_BASED_OUTPATIENT_CLINIC_OR_DEPARTMENT_OTHER): Payer: Medicare PPO | Admitting: Hematology and Oncology

## 2022-10-26 DIAGNOSIS — C7A019 Malignant carcinoid tumor of the small intestine, unspecified portion: Secondary | ICD-10-CM | POA: Diagnosis not present

## 2022-10-26 NOTE — Assessment & Plan Note (Signed)
Well-differentiated neuroendocrine tumor, carcinoid with 1/3 positive lymph node and serosal invasion Dr. Rosendo Gros who performed small bowel resection with anastomosis on 07/27/2016.    Blood work   01/22/2020: CBC CMP and normal.Chromogranin A : 100 (normal) 03/18/2022: CBC CMP normal, chromogranin A 122.9 With the increase in the chromogranin a levels, we obtained a PET CT scan ([facility]): Benign 10/15/2022: CBC CMP: Normal chromogranin A 93.8   Plan: Surveillance 01/17/2019: CT abd and Pelvis: No evidence of tumor recurrence. 3 mm RLL lung nodule probably benign 01/22/2020: CT abdomen pelvis: No signs of disease recurrence.  Signs of mild bowel resection, moderate hiatal hernia  04/08/2022: Negative PET [facility] scan. No findings for recurrent or metastatic carcinoid tumor.   Recheck chromogranin A in 3 months and follow-up after that with a telephone visit

## 2022-10-27 ENCOUNTER — Telehealth: Payer: Self-pay | Admitting: Hematology and Oncology

## 2022-10-27 NOTE — Telephone Encounter (Signed)
Scheduled appointment per 11/27 los. Was in the middle of leaving and the phone was hung up.

## 2023-01-27 ENCOUNTER — Other Ambulatory Visit: Payer: Medicare PPO

## 2023-01-31 ENCOUNTER — Telehealth: Payer: Medicare PPO | Admitting: Hematology and Oncology

## 2023-02-04 ENCOUNTER — Other Ambulatory Visit: Payer: Self-pay

## 2023-02-04 DIAGNOSIS — C7A019 Malignant carcinoid tumor of the small intestine, unspecified portion: Secondary | ICD-10-CM

## 2023-02-07 ENCOUNTER — Inpatient Hospital Stay: Payer: Medicare PPO | Attending: Hematology and Oncology

## 2023-02-07 DIAGNOSIS — R978 Other abnormal tumor markers: Secondary | ICD-10-CM | POA: Insufficient documentation

## 2023-02-07 DIAGNOSIS — Z85060 Personal history of malignant carcinoid tumor of small intestine: Secondary | ICD-10-CM | POA: Insufficient documentation

## 2023-02-07 DIAGNOSIS — C7A019 Malignant carcinoid tumor of the small intestine, unspecified portion: Secondary | ICD-10-CM

## 2023-02-07 LAB — CMP (CANCER CENTER ONLY)
ALT: 20 U/L (ref 0–44)
AST: 24 U/L (ref 15–41)
Albumin: 4.2 g/dL (ref 3.5–5.0)
Alkaline Phosphatase: 61 U/L (ref 38–126)
Anion gap: 5 (ref 5–15)
BUN: 21 mg/dL (ref 8–23)
CO2: 31 mmol/L (ref 22–32)
Calcium: 9.7 mg/dL (ref 8.9–10.3)
Chloride: 105 mmol/L (ref 98–111)
Creatinine: 0.92 mg/dL (ref 0.44–1.00)
GFR, Estimated: >60 mL/min (ref >60)
Glucose, Bld: 81 mg/dL (ref 70–99)
Potassium: 4 mmol/L (ref 3.5–5.1)
Sodium: 141 mmol/L (ref 135–145)
Total Bilirubin: 0.5 mg/dL (ref 0.3–1.2)
Total Protein: 7.2 g/dL (ref 6.5–8.1)

## 2023-02-07 LAB — CBC WITH DIFFERENTIAL (CANCER CENTER ONLY)
Abs Immature Granulocytes: 0.01 10*3/uL (ref 0.00–0.07)
Basophils Absolute: 0 10*3/uL (ref 0.0–0.1)
Basophils Relative: 1 %
Eosinophils Absolute: 0.2 10*3/uL (ref 0.0–0.5)
Eosinophils Relative: 3 %
HCT: 45.4 % (ref 36.0–46.0)
Hemoglobin: 15 g/dL (ref 12.0–15.0)
Immature Granulocytes: 0 %
Lymphocytes Relative: 39 %
Lymphs Abs: 2.4 10*3/uL (ref 0.7–4.0)
MCH: 30.7 pg (ref 26.0–34.0)
MCHC: 33 g/dL (ref 30.0–36.0)
MCV: 93 fL (ref 80.0–100.0)
Monocytes Absolute: 0.7 10*3/uL (ref 0.1–1.0)
Monocytes Relative: 12 %
Neutro Abs: 2.7 10*3/uL (ref 1.7–7.7)
Neutrophils Relative %: 45 %
Platelet Count: 378 10*3/uL (ref 150–400)
RBC: 4.88 MIL/uL (ref 3.87–5.11)
RDW: 12.1 % (ref 11.5–15.5)
WBC Count: 6 10*3/uL (ref 4.0–10.5)
nRBC: 0 % (ref 0.0–0.2)

## 2023-02-09 NOTE — Progress Notes (Signed)
HEMATOLOGY-ONCOLOGY TELEPHONE VISIT PROGRESS NOTE  I connected with our patient on 02/14/23 at  2:30 PM EDT by telephone and verified that I am speaking with the correct person using two identifiers.  I discussed the limitations, risks, security and privacy concerns of performing an evaluation and management service by telephone and the availability of in person appointments.  I also discussed with the patient that there may be a patient responsible charge related to this service. The patient expressed understanding and agreed to proceed.   History of Present Illness: Anne Stark is a 72 y.o with a history of carcinoid tumor of the small intestine. She presents to the clinic via phone visit for a follow-up after recheck chromogranin A.  She does not have any increased symptoms of diarrhea or any other issues.  REVIEW OF SYSTEMS:   Constitutional: Denies fevers, chills or abnormal weight loss All other systems were reviewed with the patient and are negative. 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 Rosendo Gros who performed small bowel resection with anastomosis on 07/27/2016.    Blood work   01/22/2020: CBC CMP and normal.Chromogranin A : 100 (normal) 03/18/2022: CBC CMP normal, chromogranin A 122.9 With the increase in the chromogranin a levels, we obtained a PET CT scan ([facility]): Benign 10/15/2022: CBC CMP: Normal chromogranin A 93.8   Plan: Surveillance 01/17/2019: CT abd and Pelvis: No evidence of tumor recurrence. 3 mm RLL lung nodule probably benign 01/22/2020: CT abdomen pelvis: No signs of disease recurrence.  Signs of mild bowel resection, moderate hiatal hernia  04/08/2022: Negative PET [facility] scan. No findings for recurrent or metastatic carcinoid tumor.   Chromogranin A: 02/07/2023: 148.9 (used to be 93.8) Recommend obtaining another  PET [facility] scan  Telephone visit after that to discuss results  I discussed the assessment and treatment plan with the patient. The patient was provided an opportunity to ask questions and all were answered. The patient agreed with the plan and demonstrated an understanding of the instructions. The patient was advised to call back or seek an in-person evaluation if the symptoms worsen or if the condition fails to improve as anticipated.   I provided 12 minutes of non-face-to-face time during this encounter.  This includes time for charting and coordination of care   Harriette Ohara, MD  I Gardiner Coins am acting as a scribe for Dr.Jaevon Paras  I have reviewed the above documentation for accuracy and completeness, and I agree with the above. Hello Anne Stark Anne Stark Dr. Lindi Adie here while he is here

## 2023-02-10 LAB — CHROMOGRANIN A: Chromogranin A (ng/mL): 148.9 ng/mL — ABNORMAL HIGH (ref 0.0–101.8)

## 2023-02-14 ENCOUNTER — Inpatient Hospital Stay (HOSPITAL_BASED_OUTPATIENT_CLINIC_OR_DEPARTMENT_OTHER): Payer: Medicare PPO | Admitting: Hematology and Oncology

## 2023-02-14 ENCOUNTER — Telehealth: Payer: Self-pay | Admitting: Hematology and Oncology

## 2023-02-14 DIAGNOSIS — C7A019 Malignant carcinoid tumor of the small intestine, unspecified portion: Secondary | ICD-10-CM

## 2023-02-14 NOTE — Assessment & Plan Note (Signed)
Well-differentiated neuroendocrine tumor, carcinoid with 1/3 positive lymph node and serosal invasion Dr. Rosendo Gros who performed small bowel resection with anastomosis on 07/27/2016.    Blood work   01/22/2020: CBC CMP and normal.Chromogranin A : 100 (normal) 03/18/2022: CBC CMP normal, chromogranin A 122.9 With the increase in the chromogranin a levels, we obtained a PET CT scan ([facility]): Benign 10/15/2022: CBC CMP: Normal chromogranin A 93.8   Plan: Surveillance 01/17/2019: CT abd and Pelvis: No evidence of tumor recurrence. 3 mm RLL lung nodule probably benign 01/22/2020: CT abdomen pelvis: No signs of disease recurrence.  Signs of mild bowel resection, moderate hiatal hernia  04/08/2022: Negative PET [facility] scan. No findings for recurrent or metastatic carcinoid tumor.   Chromogranin A: 02/07/2023: 148.9 (used to be 93.8) Recommend obtaining another PET [facility] scan

## 2023-02-14 NOTE — Telephone Encounter (Signed)
Scheduled appointment per 3/18 los. Patient is aware of the made appointment.

## 2023-02-23 DIAGNOSIS — E7800 Pure hypercholesterolemia, unspecified: Secondary | ICD-10-CM | POA: Diagnosis not present

## 2023-02-23 DIAGNOSIS — I10 Essential (primary) hypertension: Secondary | ICD-10-CM | POA: Diagnosis not present

## 2023-02-23 DIAGNOSIS — E039 Hypothyroidism, unspecified: Secondary | ICD-10-CM | POA: Diagnosis not present

## 2023-03-07 ENCOUNTER — Encounter (HOSPITAL_COMMUNITY)
Admission: RE | Admit: 2023-03-07 | Discharge: 2023-03-07 | Disposition: A | Discharge status: Home / Self Care | Payer: Medicare PPO | Source: Ambulatory Visit | Attending: Hematology and Oncology | Admitting: Hematology and Oncology

## 2023-03-07 DIAGNOSIS — C7A8 Other malignant neuroendocrine tumors: Secondary | ICD-10-CM | POA: Diagnosis not present

## 2023-03-07 DIAGNOSIS — C7A019 Malignant carcinoid tumor of the small intestine, unspecified portion: Secondary | ICD-10-CM | POA: Insufficient documentation

## 2023-03-07 MED ORDER — COPPER CU 64 DOTATATE 1 MCI/ML IV SOLN
4.0000 | Freq: Once | INTRAVENOUS | Status: AC
  Administered 2023-03-07: 3.96 via INTRAVENOUS

## 2023-03-10 NOTE — Progress Notes (Signed)
HEMATOLOGY-ONCOLOGY TELEPHONE VISIT PROGRESS NOTE  I connected with our patient on 03/11/23 at  8:15 AM EDT by telephone and verified that I am speaking with the correct person using two identifiers.  I discussed the limitations, risks, security and privacy concerns of performing an evaluation and management service by telephone and the availability of in person appointments.  I also discussed with the patient that there may be a patient responsible charge related to this service. The patient expressed understanding and agreed to proceed.   History of Present Illness:  Anne Stark is a 72 y.o with a history of carcinoid tumor of the small intestine. She presents to the clinic via phone visit for a follow-up to review Pet scan.    REVIEW OF SYSTEMS:   Constitutional: Denies fevers, chills or abnormal weight loss All other systems were reviewed with the patient and are negative. 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 Derrell Lolling who performed small bowel resection with anastomosis on 07/27/2016.    Blood work   01/22/2020: CBC CMP and normal.Chromogranin A : 100 (normal) 03/18/2022: CBC CMP normal, chromogranin A 122.9 With the increase in the chromogranin a levels, we obtained a PET CT scan ([facility]): Benign 10/15/2022: CBC CMP: Normal chromogranin A 93.8 02/07/2023: Chromogranin A 148.9   Plan: Surveillance 01/17/2019: CT abd and Pelvis: No evidence of tumor recurrence. 3 mm RLL lung nodule probably benign 01/22/2020: CT abdomen pelvis: No signs of disease recurrence.  Signs of mild bowel resection, moderate hiatal hernia  04/08/2022: Negative PET [facility] scan. No findings for recurrent or metastatic carcinoid tumor. 03/07/2023: [facility] PET scan: No evidence of well-differentiated neuroendocrine tumor  Recommendation: We will recheck labs again  in 3 months and follow-up after that with a telephone visit   I discussed the assessment and treatment plan with the patient. The patient was provided an opportunity to ask questions and all were answered. The patient agreed with the plan and demonstrated an understanding of the instructions. The patient was advised to call back or seek an in-person evaluation if the symptoms worsen or if the condition fails to improve as anticipated.   I provided 12 minutes of non-face-to-face time during this encounter.  This includes time for charting and coordination of care   Anne Meek, MD  I Janan Ridge am acting as a scribe for Dr.Collie Kittel  I have reviewed the above documentation for accuracy and completeness, and I agree with the above.

## 2023-03-11 ENCOUNTER — Inpatient Hospital Stay: Payer: Medicare PPO | Admitting: Hematology and Oncology

## 2023-03-11 DIAGNOSIS — C7A019 Malignant carcinoid tumor of the small intestine, unspecified portion: Secondary | ICD-10-CM | POA: Diagnosis not present

## 2023-03-11 NOTE — Assessment & Plan Note (Signed)
Well-differentiated neuroendocrine tumor, carcinoid with 1/3 positive lymph node and serosal invasion Dr. Derrell Lolling who performed small bowel resection with anastomosis on 07/27/2016.    Blood work   01/22/2020: CBC CMP and normal.Chromogranin A : 100 (normal) 03/18/2022: CBC CMP normal, chromogranin A 122.9 With the increase in the chromogranin a levels, we obtained a PET CT scan ([facility]): Benign 10/15/2022: CBC CMP: Normal chromogranin A 93.8 02/07/2023: Chromogranin A 148.9   Plan: Surveillance 01/17/2019: CT abd and Pelvis: No evidence of tumor recurrence. 3 mm RLL lung nodule probably benign 01/22/2020: CT abdomen pelvis: No signs of disease recurrence.  Signs of mild bowel resection, moderate hiatal hernia  04/08/2022: Negative PET [facility] scan. No findings for recurrent or metastatic carcinoid tumor. 03/07/2023: [facility] PET scan: No evidence of well-differentiated neuroendocrine tumor  Recommendation: We will recheck labs again in 3 months and follow-up after that with a telephone visit

## 2023-03-14 ENCOUNTER — Telehealth: Payer: Self-pay | Admitting: Hematology and Oncology

## 2023-03-14 NOTE — Telephone Encounter (Signed)
Scheduled appointments per 4/12 los. Left voicemail.

## 2023-03-21 ENCOUNTER — Other Ambulatory Visit: Payer: Medicare PPO

## 2023-03-21 DIAGNOSIS — M7062 Trochanteric bursitis, left hip: Secondary | ICD-10-CM | POA: Diagnosis not present

## 2023-03-28 ENCOUNTER — Ambulatory Visit: Payer: Medicare PPO | Admitting: Hematology and Oncology

## 2023-04-06 DIAGNOSIS — E876 Hypokalemia: Secondary | ICD-10-CM | POA: Diagnosis not present

## 2023-04-06 DIAGNOSIS — E785 Hyperlipidemia, unspecified: Secondary | ICD-10-CM | POA: Diagnosis not present

## 2023-04-06 DIAGNOSIS — I10 Essential (primary) hypertension: Secondary | ICD-10-CM | POA: Diagnosis not present

## 2023-04-06 DIAGNOSIS — M8580 Other specified disorders of bone density and structure, unspecified site: Secondary | ICD-10-CM | POA: Diagnosis not present

## 2023-04-06 DIAGNOSIS — D3A019 Benign carcinoid tumor of the small intestine, unspecified portion: Secondary | ICD-10-CM | POA: Diagnosis not present

## 2023-04-06 DIAGNOSIS — M1990 Unspecified osteoarthritis, unspecified site: Secondary | ICD-10-CM | POA: Diagnosis not present

## 2023-04-06 DIAGNOSIS — R32 Unspecified urinary incontinence: Secondary | ICD-10-CM | POA: Diagnosis not present

## 2023-04-06 DIAGNOSIS — E039 Hypothyroidism, unspecified: Secondary | ICD-10-CM | POA: Diagnosis not present

## 2023-04-06 DIAGNOSIS — R609 Edema, unspecified: Secondary | ICD-10-CM | POA: Diagnosis not present

## 2023-04-25 NOTE — Progress Notes (Signed)
HEMATOLOGY-ONCOLOGY TELEPHONE VISIT PROGRESS NOTE  I connected with our patient on 05/06/23 at 12:00 PM EDT by telephone and verified that I am speaking with the correct person using two identifiers.  I discussed the limitations, risks, security and privacy concerns of performing an evaluation and management service by telephone and the availability of in person appointments.  I also discussed with the patient that there may be a patient responsible charge related to this service. The patient expressed understanding and agreed to proceed.   History of Present Illness: Anne Stark is a 72 y.o with a history of carcinoid tumor of the small intestine. She presents to the clinic via phone visit for a follow-up to review labs.  Patient had some issues with bursitis but it has resolved.  No further GI issues.  REVIEW OF SYSTEMS:   Constitutional: Denies fevers, chills or abnormal weight loss All other systems were reviewed with the patient and are negative.  Observations/Objective:    Assessment Plan:  Carcinoid tumor of small intestine Well-differentiated neuroendocrine tumor, carcinoid with 1/3 positive lymph node and serosal invasion Dr. Derrell Lolling who performed small bowel resection with anastomosis on 07/27/2016.    Blood work   01/22/2020: CBC CMP and normal.Chromogranin A : 100 (normal) 03/18/2022: CBC CMP normal, chromogranin A 122.9 With the increase in the chromogranin a levels, we obtained a PET CT scan ([facility]): Benign 10/15/2022: CBC CMP: Normal chromogranin A 93.8 02/07/2023: Chromogranin A 148.9   Plan: Surveillance 01/17/2019: CT abd and Pelvis: No evidence of tumor recurrence. 3 mm RLL lung nodule probably benign 01/22/2020: CT abdomen pelvis: No signs of disease recurrence.  Signs of mild bowel resection, moderate hiatal hernia  04/08/2022: Negative PET [facility] scan. No findings for recurrent or metastatic carcinoid tumor. 03/07/2023: [facility] PET scan: No evidence of  well-differentiated neuroendocrine tumor 04/29/2023: Chromogranin A 112.3 (used to be 148.9), CBC CMP no major abnormalities.  Bursitis hip: Better Going to [place] in Aug 2024 for a conference.  Recheck labs again in 3 months and follow-up after that with telephone visit.   I discussed the assessment and treatment plan with the patient. The patient was provided an opportunity to ask questions and all were answered. The patient agreed with the plan and demonstrated an understanding of the instructions. The patient was advised to call back or seek an in-person evaluation if the symptoms worsen or if the condition fails to improve as anticipated.   I provided 12 minutes of non-face-to-face time during this encounter.  This includes time for charting and coordination of care   Tamsen Meek, MD  I Janan Ridge am acting as a scribe for Dr.Jadin Creque  I have reviewed the above documentation for accuracy and completeness, and I agree with the above.

## 2023-04-29 ENCOUNTER — Inpatient Hospital Stay: Payer: Medicare PPO | Attending: Hematology and Oncology

## 2023-04-29 DIAGNOSIS — Z85060 Personal history of malignant carcinoid tumor of small intestine: Secondary | ICD-10-CM | POA: Insufficient documentation

## 2023-04-29 DIAGNOSIS — C7A019 Malignant carcinoid tumor of the small intestine, unspecified portion: Secondary | ICD-10-CM

## 2023-04-29 LAB — CMP (CANCER CENTER ONLY)
ALT: 23 U/L (ref 0–44)
AST: 24 U/L (ref 15–41)
Albumin: 4.3 g/dL (ref 3.5–5.0)
Alkaline Phosphatase: 62 U/L (ref 38–126)
Anion gap: 8 (ref 5–15)
BUN: 18 mg/dL (ref 8–23)
CO2: 32 mmol/L (ref 22–32)
Calcium: 10.4 mg/dL — ABNORMAL HIGH (ref 8.9–10.3)
Chloride: 101 mmol/L (ref 98–111)
Creatinine: 0.99 mg/dL (ref 0.44–1.00)
GFR, Estimated: >60 mL/min (ref >60)
Glucose, Bld: 97 mg/dL (ref 70–99)
Potassium: 4.5 mmol/L (ref 3.5–5.1)
Sodium: 141 mmol/L (ref 135–145)
Total Bilirubin: 0.6 mg/dL (ref 0.3–1.2)
Total Protein: 7.6 g/dL (ref 6.5–8.1)

## 2023-04-29 LAB — CBC WITH DIFFERENTIAL (CANCER CENTER ONLY)
Abs Immature Granulocytes: 0.04 10*3/uL (ref 0.00–0.07)
Basophils Absolute: 0 10*3/uL (ref 0.0–0.1)
Basophils Relative: 0 %
Eosinophils Absolute: 0.1 10*3/uL (ref 0.0–0.5)
Eosinophils Relative: 1 %
HCT: 47.1 % — ABNORMAL HIGH (ref 36.0–46.0)
Hemoglobin: 15.5 g/dL — ABNORMAL HIGH (ref 12.0–15.0)
Immature Granulocytes: 1 %
Lymphocytes Relative: 35 %
Lymphs Abs: 3 10*3/uL (ref 0.7–4.0)
MCH: 30.8 pg (ref 26.0–34.0)
MCHC: 32.9 g/dL (ref 30.0–36.0)
MCV: 93.6 fL (ref 80.0–100.0)
Monocytes Absolute: 0.8 10*3/uL (ref 0.1–1.0)
Monocytes Relative: 10 %
Neutro Abs: 4.6 10*3/uL (ref 1.7–7.7)
Neutrophils Relative %: 53 %
Platelet Count: 419 10*3/uL — ABNORMAL HIGH (ref 150–400)
RBC: 5.03 MIL/uL (ref 3.87–5.11)
RDW: 12.5 % (ref 11.5–15.5)
WBC Count: 8.5 10*3/uL (ref 4.0–10.5)
nRBC: 0 % (ref 0.0–0.2)

## 2023-05-04 DIAGNOSIS — M7062 Trochanteric bursitis, left hip: Secondary | ICD-10-CM | POA: Diagnosis not present

## 2023-05-05 ENCOUNTER — Telehealth: Payer: Self-pay | Admitting: Hematology and Oncology

## 2023-05-05 LAB — CHROMOGRANIN A: Chromogranin A (ng/mL): 112.3 ng/mL — ABNORMAL HIGH (ref 0.0–101.8)

## 2023-05-05 NOTE — Telephone Encounter (Signed)
Contacted patient to scheduled appointments. Patient is aware of appointments that are scheduled.   

## 2023-05-06 ENCOUNTER — Inpatient Hospital Stay: Payer: Medicare PPO | Admitting: Hematology and Oncology

## 2023-05-06 DIAGNOSIS — C7A019 Malignant carcinoid tumor of the small intestine, unspecified portion: Secondary | ICD-10-CM | POA: Diagnosis not present

## 2023-05-06 NOTE — Assessment & Plan Note (Addendum)
Well-differentiated neuroendocrine tumor, carcinoid with 1/3 positive lymph node and serosal invasion Dr. Derrell Lolling who performed small bowel resection with anastomosis on 07/27/2016.    Blood work   01/22/2020: CBC CMP and normal.Chromogranin A : 100 (normal) 03/18/2022: CBC CMP normal, chromogranin A 122.9 With the increase in the chromogranin a levels, we obtained a PET CT scan ([facility]): Benign 10/15/2022: CBC CMP: Normal chromogranin A 93.8 02/07/2023: Chromogranin A 148.9   Plan: Surveillance 01/17/2019: CT abd and Pelvis: No evidence of tumor recurrence. 3 mm RLL lung nodule probably benign 01/22/2020: CT abdomen pelvis: No signs of disease recurrence.  Signs of mild bowel resection, moderate hiatal hernia  04/08/2022: Negative PET [facility] scan. No findings for recurrent or metastatic carcinoid tumor. 03/07/2023: [facility] PET scan: No evidence of well-differentiated neuroendocrine tumor 04/29/2023: Chromogranin A 112.3 (used to be 148.9), CBC CMP no major abnormalities.  Bursitis hip: Better Going to [place] in Aug 2024 for a conference.  Recheck labs again in 3 months and follow-up after that with telephone visit.

## 2023-05-17 ENCOUNTER — Telehealth: Payer: Self-pay | Admitting: Hematology and Oncology

## 2023-05-17 NOTE — Telephone Encounter (Signed)
Scheduled appointments per los. Left voicemail. 

## 2023-06-22 DIAGNOSIS — M7062 Trochanteric bursitis, left hip: Secondary | ICD-10-CM | POA: Diagnosis not present

## 2023-08-05 ENCOUNTER — Inpatient Hospital Stay: Payer: Medicare PPO | Attending: Hematology and Oncology

## 2023-08-05 DIAGNOSIS — Z85060 Personal history of malignant carcinoid tumor of small intestine: Secondary | ICD-10-CM | POA: Diagnosis not present

## 2023-08-05 DIAGNOSIS — C7A019 Malignant carcinoid tumor of the small intestine, unspecified portion: Secondary | ICD-10-CM

## 2023-08-05 LAB — CBC WITH DIFFERENTIAL (CANCER CENTER ONLY)
Abs Immature Granulocytes: 0.05 10*3/uL (ref 0.00–0.07)
Basophils Absolute: 0.1 10*3/uL (ref 0.0–0.1)
Basophils Relative: 1 %
Eosinophils Absolute: 0.1 10*3/uL (ref 0.0–0.5)
Eosinophils Relative: 2 %
HCT: 43.9 % (ref 36.0–46.0)
Hemoglobin: 14.4 g/dL (ref 12.0–15.0)
Immature Granulocytes: 1 %
Lymphocytes Relative: 34 %
Lymphs Abs: 2.3 10*3/uL (ref 0.7–4.0)
MCH: 31.2 pg (ref 26.0–34.0)
MCHC: 32.8 g/dL (ref 30.0–36.0)
MCV: 95 fL (ref 80.0–100.0)
Monocytes Absolute: 0.7 10*3/uL (ref 0.1–1.0)
Monocytes Relative: 10 %
Neutro Abs: 3.5 10*3/uL (ref 1.7–7.7)
Neutrophils Relative %: 52 %
Platelet Count: 439 10*3/uL — ABNORMAL HIGH (ref 150–400)
RBC: 4.62 MIL/uL (ref 3.87–5.11)
RDW: 11.7 % (ref 11.5–15.5)
WBC Count: 6.7 10*3/uL (ref 4.0–10.5)
nRBC: 0 % (ref 0.0–0.2)

## 2023-08-05 LAB — CMP (CANCER CENTER ONLY)
ALT: 19 U/L (ref 0–44)
AST: 23 U/L (ref 15–41)
Albumin: 4.2 g/dL (ref 3.5–5.0)
Alkaline Phosphatase: 54 U/L (ref 38–126)
Anion gap: 5 (ref 5–15)
BUN: 18 mg/dL (ref 8–23)
CO2: 33 mmol/L — ABNORMAL HIGH (ref 22–32)
Calcium: 9.9 mg/dL (ref 8.9–10.3)
Chloride: 101 mmol/L (ref 98–111)
Creatinine: 1.02 mg/dL — ABNORMAL HIGH (ref 0.44–1.00)
GFR, Estimated: 58 mL/min — ABNORMAL LOW (ref >60)
Glucose, Bld: 89 mg/dL (ref 70–99)
Potassium: 4.5 mmol/L (ref 3.5–5.1)
Sodium: 139 mmol/L (ref 135–145)
Total Bilirubin: 0.6 mg/dL (ref 0.3–1.2)
Total Protein: 7.2 g/dL (ref 6.5–8.1)

## 2023-08-08 NOTE — Progress Notes (Signed)
HEMATOLOGY-ONCOLOGY TELEPHONE VISIT PROGRESS NOTE  I connected with our patient on 08/12/23 at 11:15 AM EDT by telephone and verified that I am speaking with the correct person using two identifiers.  I discussed the limitations, risks, security and privacy concerns of performing an evaluation and management service by telephone and the availability of in person appointments.  I also discussed with the patient that there may be a patient responsible charge related to this service. The patient expressed understanding and agreed to proceed.   History of Present Illness: Anne Stark is a 72 y.o with a history of carcinoid tumor of the small intestine. She presents to the clinic via phone visit for a follow-up to review labs.   REVIEW OF SYSTEMS:   Constitutional: Denies fevers, chills or abnormal weight loss All other systems were reviewed with the patient and are negative. 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 Derrell Lolling who performed small bowel resection with anastomosis on 07/27/2016.    Blood work   01/22/2020: CBC CMP and normal.Chromogranin A : 100 (normal) 03/18/2022: CBC CMP normal, chromogranin A 122.9 PET CT scan ([facility]): Benign 10/15/2022: CBC CMP: Normal chromogranin A 93.8 02/07/2023: Chromogranin A 148.9 04/29/2023: Chromogranin A 112.3 (used to be 148.9), CBC CMP no major abnormalities. 08/05/2023: Chromogranin A 128.5, CBC CMP benign   Plan: Surveillance 01/17/2019: CT abd and Pelvis: No evidence of tumor recurrence. 3 mm RLL lung nodule probably benign 01/22/2020: CT abdomen pelvis: No signs of disease recurrence.  Signs of mild bowel resection, moderate hiatal hernia  04/08/2022: Negative PET [facility] scan. No findings for recurrent or metastatic carcinoid tumor. 03/07/2023: [facility] PET scan: No evidence of well-differentiated  neuroendocrine tumor   Bursitis hip: Better Went to [place] in Aug 2024 for a conference.   Recheck labs again in 3 months and follow-up after that with telephone visit.    I discussed the assessment and treatment plan with the patient. The patient was provided an opportunity to ask questions and all were answered. The patient agreed with the plan and demonstrated an understanding of the instructions. The patient was advised to call back or seek an in-person evaluation if the symptoms worsen or if the condition fails to improve as anticipated.   I provided 12 minutes of non-face-to-face time during this encounter.  This includes time for charting and coordination of care   Tamsen Meek, MD  I Janan Ridge am acting as a scribe for Dr.Clatie Kessen  I have reviewed the above documentation for accuracy and completeness, and I agree with the above.

## 2023-08-09 LAB — CHROMOGRANIN A: Chromogranin A (ng/mL): 128.5 ng/mL — ABNORMAL HIGH (ref 0.0–101.8)

## 2023-08-12 ENCOUNTER — Inpatient Hospital Stay (HOSPITAL_BASED_OUTPATIENT_CLINIC_OR_DEPARTMENT_OTHER): Payer: Medicare PPO | Admitting: Hematology and Oncology

## 2023-08-12 DIAGNOSIS — C7A019 Malignant carcinoid tumor of the small intestine, unspecified portion: Secondary | ICD-10-CM

## 2023-08-12 NOTE — Assessment & Plan Note (Signed)
Well-differentiated neuroendocrine tumor, carcinoid with 1/3 positive lymph node and serosal invasion Dr. Derrell Lolling who performed small bowel resection with anastomosis on 07/27/2016.    Blood work   01/22/2020: CBC CMP and normal.Chromogranin A : 100 (normal) 03/18/2022: CBC CMP normal, chromogranin A 122.9 PET CT scan ([facility]): Benign 10/15/2022: CBC CMP: Normal chromogranin A 93.8 02/07/2023: Chromogranin A 148.9 04/29/2023: Chromogranin A 112.3 (used to be 148.9), CBC CMP no major abnormalities. 08/05/2023: Chromogranin A 128.5, CBC CMP benign   Plan: Surveillance 01/17/2019: CT abd and Pelvis: No evidence of tumor recurrence. 3 mm RLL lung nodule probably benign 01/22/2020: CT abdomen pelvis: No signs of disease recurrence.  Signs of mild bowel resection, moderate hiatal hernia  04/08/2022: Negative PET [facility] scan. No findings for recurrent or metastatic carcinoid tumor. 03/07/2023: [facility] PET scan: No evidence of well-differentiated neuroendocrine tumor   Bursitis hip: Better Going to [place] in Aug 2024 for a conference.   Recheck labs again in 3 months and follow-up after that with telephone visit.

## 2023-08-19 DIAGNOSIS — Z1231 Encounter for screening mammogram for malignant neoplasm of breast: Secondary | ICD-10-CM | POA: Diagnosis not present

## 2023-09-21 IMAGING — CT NM PET SKULL BASE TO THIGH
1 of 7 series · 1 of 25 positions shown · non-contrast
Comparison: Multiple prior CT scans.

CLINICAL DATA: Subsequent treatment strategy for history of small
bowel carcinoid tumor. Initial diagnosis 7021.

EXAM:
NUCLEAR MEDICINE PET SKULL BASE TO THIGH
TECHNIQUE: 4.19 mCi Cu 64 DOTATATE was injected intravenously. Full-ring PET
imaging was performed from the skull base to thigh after the
radiotracer. CT data was obtained and used for attenuation
correction and anatomic localization.

[Series 4: ct sk_thigh 5.0 br38 · axial · 5.0mm · 0.98mm/px · 1 of 202 slices shown]
[im 202/202  brain]
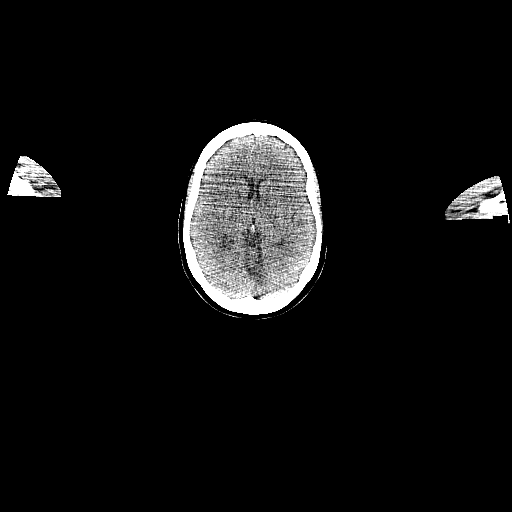

[1 of 25 positions shown; findings below may reference images not displayed]

FINDINGS: NECK

No radiotracer activity in neck lymph nodes.

Incidental CT findings: None

CHEST

No radiotracer accumulation within mediastinal or hilar lymph nodes.
No suspicious pulmonary nodules on the CT scan.

Incidental CT finding:Mild cardiac enlargement. Moderate to large
hiatal hernia.

ABDOMEN/PELVIS

No abnormal radiotracer accumulation in the small bowel, mesentery
or lymph nodes to suggest metastatic disease. No evidence of hepatic
metastatic disease.

Physiologic activity noted in the liver, spleen, adrenal glands and
kidneys.

Incidental CT findings:Scattered vascular calcifications but no
aneurysm. Stable surgical changes involving the small bowel from
prior small bowel resection. No mesenteric or retroperitoneal
lesions or adenopathy.

SKELETON

No focal activity to suggest skeletal metastasis.

Incidental CT findings:None
IMPRESSION: Negative PET Dotatate scan. No findings for recurrent or metastatic
carcinoid tumor.

## 2023-10-17 DIAGNOSIS — I10 Essential (primary) hypertension: Secondary | ICD-10-CM | POA: Diagnosis not present

## 2023-10-17 DIAGNOSIS — M8580 Other specified disorders of bone density and structure, unspecified site: Secondary | ICD-10-CM | POA: Diagnosis not present

## 2023-10-17 DIAGNOSIS — E7800 Pure hypercholesterolemia, unspecified: Secondary | ICD-10-CM | POA: Diagnosis not present

## 2023-10-17 DIAGNOSIS — Z859 Personal history of malignant neoplasm, unspecified: Secondary | ICD-10-CM | POA: Diagnosis not present

## 2023-10-17 DIAGNOSIS — E039 Hypothyroidism, unspecified: Secondary | ICD-10-CM | POA: Diagnosis not present

## 2023-10-17 DIAGNOSIS — Z0000 Encounter for general adult medical examination without abnormal findings: Secondary | ICD-10-CM | POA: Diagnosis not present

## 2023-10-17 DIAGNOSIS — I872 Venous insufficiency (chronic) (peripheral): Secondary | ICD-10-CM | POA: Diagnosis not present

## 2023-10-17 DIAGNOSIS — Z1331 Encounter for screening for depression: Secondary | ICD-10-CM | POA: Diagnosis not present

## 2023-10-17 DIAGNOSIS — J309 Allergic rhinitis, unspecified: Secondary | ICD-10-CM | POA: Diagnosis not present

## 2024-02-02 ENCOUNTER — Telehealth: Payer: Self-pay | Admitting: Hematology and Oncology

## 2024-02-02 NOTE — Telephone Encounter (Signed)
Scheduled appointments per patients request. Patient is aware of the made appointments.

## 2024-02-10 ENCOUNTER — Inpatient Hospital Stay: Attending: Hematology and Oncology

## 2024-02-10 DIAGNOSIS — C7B01 Secondary carcinoid tumors of distant lymph nodes: Secondary | ICD-10-CM | POA: Insufficient documentation

## 2024-02-10 DIAGNOSIS — C7A019 Malignant carcinoid tumor of the small intestine, unspecified portion: Secondary | ICD-10-CM | POA: Diagnosis not present

## 2024-02-10 LAB — CBC WITH DIFFERENTIAL (CANCER CENTER ONLY)
Abs Immature Granulocytes: 0.02 10*3/uL (ref 0.00–0.07)
Basophils Absolute: 0 10*3/uL (ref 0.0–0.1)
Basophils Relative: 1 %
Eosinophils Absolute: 0.2 10*3/uL (ref 0.0–0.5)
Eosinophils Relative: 3 %
HCT: 42.1 % (ref 36.0–46.0)
Hemoglobin: 13.8 g/dL (ref 12.0–15.0)
Immature Granulocytes: 0 %
Lymphocytes Relative: 39 %
Lymphs Abs: 2.3 10*3/uL (ref 0.7–4.0)
MCH: 29.8 pg (ref 26.0–34.0)
MCHC: 32.8 g/dL (ref 30.0–36.0)
MCV: 90.9 fL (ref 80.0–100.0)
Monocytes Absolute: 0.6 10*3/uL (ref 0.1–1.0)
Monocytes Relative: 10 %
Neutro Abs: 2.7 10*3/uL (ref 1.7–7.7)
Neutrophils Relative %: 47 %
Platelet Count: 374 10*3/uL (ref 150–400)
RBC: 4.63 MIL/uL (ref 3.87–5.11)
RDW: 12.5 % (ref 11.5–15.5)
WBC Count: 5.8 10*3/uL (ref 4.0–10.5)
nRBC: 0 % (ref 0.0–0.2)

## 2024-02-10 LAB — CMP (CANCER CENTER ONLY)
ALT: 16 U/L (ref 0–44)
AST: 21 U/L (ref 15–41)
Albumin: 4.1 g/dL (ref 3.5–5.0)
Alkaline Phosphatase: 60 U/L (ref 38–126)
Anion gap: 6 (ref 5–15)
BUN: 19 mg/dL (ref 8–23)
CO2: 31 mmol/L (ref 22–32)
Calcium: 9.4 mg/dL (ref 8.9–10.3)
Chloride: 104 mmol/L (ref 98–111)
Creatinine: 1.01 mg/dL — ABNORMAL HIGH (ref 0.44–1.00)
GFR, Estimated: 58 mL/min — ABNORMAL LOW (ref >60)
Glucose, Bld: 88 mg/dL (ref 70–99)
Potassium: 4.3 mmol/L (ref 3.5–5.1)
Sodium: 141 mmol/L (ref 135–145)
Total Bilirubin: 0.5 mg/dL (ref 0.0–1.2)
Total Protein: 7.1 g/dL (ref 6.5–8.1)

## 2024-02-13 DIAGNOSIS — M1990 Unspecified osteoarthritis, unspecified site: Secondary | ICD-10-CM | POA: Diagnosis not present

## 2024-02-13 DIAGNOSIS — E039 Hypothyroidism, unspecified: Secondary | ICD-10-CM | POA: Diagnosis not present

## 2024-02-13 DIAGNOSIS — J309 Allergic rhinitis, unspecified: Secondary | ICD-10-CM | POA: Diagnosis not present

## 2024-02-13 DIAGNOSIS — I10 Essential (primary) hypertension: Secondary | ICD-10-CM | POA: Diagnosis not present

## 2024-02-13 DIAGNOSIS — J449 Chronic obstructive pulmonary disease, unspecified: Secondary | ICD-10-CM | POA: Diagnosis not present

## 2024-02-13 DIAGNOSIS — R609 Edema, unspecified: Secondary | ICD-10-CM | POA: Diagnosis not present

## 2024-02-13 DIAGNOSIS — M810 Age-related osteoporosis without current pathological fracture: Secondary | ICD-10-CM | POA: Diagnosis not present

## 2024-02-13 DIAGNOSIS — I739 Peripheral vascular disease, unspecified: Secondary | ICD-10-CM | POA: Diagnosis not present

## 2024-02-13 DIAGNOSIS — E785 Hyperlipidemia, unspecified: Secondary | ICD-10-CM | POA: Diagnosis not present

## 2024-02-13 LAB — CHROMOGRANIN A: Chromogranin A (ng/mL): 121 ng/mL — ABNORMAL HIGH (ref 0.0–101.8)

## 2024-02-14 ENCOUNTER — Inpatient Hospital Stay (HOSPITAL_BASED_OUTPATIENT_CLINIC_OR_DEPARTMENT_OTHER): Admitting: Hematology and Oncology

## 2024-02-14 DIAGNOSIS — C7A019 Malignant carcinoid tumor of the small intestine, unspecified portion: Secondary | ICD-10-CM

## 2024-02-14 NOTE — Progress Notes (Signed)
 HEMATOLOGY-ONCOLOGY TELEPHONE VISIT PROGRESS NOTE  I connected with our patient on 02/14/24 at  2:30 PM EDT by telephone and verified that I am speaking with the correct person using two identifiers.  I discussed the limitations, risks, security and privacy concerns of performing an evaluation and management service by telephone and the availability of in person appointments.  I also discussed with the patient that there may be a patient responsible charge related to this service. The patient expressed understanding and agreed to proceed.   History of Present Illness: Follow-up of small bowel carcinoid  History of Present Illness The patient, with a history of an unspecified condition monitored by chromogranin A levels, presents for a routine follow-up. She reports feeling 'fine' and denies any new symptoms such as bowel changes or sweats. Her chromogranin A level remains stable at 121, consistent with previous results over the last four to five years.  Previously, she had hip bursitis which has significantly improved following a steroid injection prior to a trip to [place] in August. She has been able to maintain her mobility without any issues since then. 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 She is also planning a move in the near future due to her spouse's retirement.      REVIEW OF SYSTEMS:   Constitutional: Denies fevers, chills or abnormal weight loss All other systems were reviewed with the patient and are negative. 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 Derrell Lolling who performed small bowel resection with anastomosis on 07/27/2016.    Blood work   01/22/2020: CBC CMP and normal.Chromogranin A : 100 (normal) 03/18/2022: CBC CMP normal, chromogranin A 122.9  PET CT scan (Dotatate): Benign 10/15/2022: CBC CMP: Normal chromogranin A 93.8 02/07/2023: Chromogranin A 148.9 04/29/2023: Chromogranin A 112.3 (used to be 148.9), CBC CMP no major abnormalities. 08/05/2023: Chromogranin A 128.5, CBC CMP benign 02/10/2024: Chromogranin A 121, CBC CMP: Benign   Plan: Surveillance 01/17/2019: CT abd and Pelvis: No evidence of tumor recurrence. 3 mm RLL lung nodule probably benign 01/22/2020: CT abdomen pelvis: No signs of disease recurrence.  Signs of mild bowel resection, moderate hiatal hernia  04/08/2022: Negative PET Dotatate scan. No findings for recurrent or metastatic carcinoid tumor. 03/07/2023: Dotatate PET scan: No evidence of well-differentiated neuroendocrine tumor   Bursitis hip: Better Went to [place] in Aug 2024 for a conference: took a steroid shot prior.  Went to [place] Jan 2024 and [place] and leaving for [place] Could be moving to [facility] once her husband to retire.   Recheck labs again in 6 months and follow-up after that with telephone visit.     I discussed the assessment and treatment plan with the patient. The patient was provided an opportunity to ask questions and all were answered. The patient agreed with the plan and demonstrated an understanding of the instructions. The patient was advised to call back or seek an in-person evaluation if the symptoms worsen or if the condition fails to improve as anticipated.   I provided 20 minutes of non-face-to-face time during this encounter.  This includes time for charting and coordination of care   Tamsen Meek, MD

## 2024-02-14 NOTE — Assessment & Plan Note (Signed)
 Well-differentiated neuroendocrine tumor, carcinoid with 1/3 positive lymph node and serosal invasion Dr. Derrell Lolling who performed small bowel resection with anastomosis on 07/27/2016.    Blood work   01/22/2020: CBC CMP and normal.Chromogranin A : 100 (normal) 03/18/2022: CBC CMP normal, chromogranin A 122.9 PET CT scan (Dotatate): Benign 10/15/2022: CBC CMP: Normal chromogranin A 93.8 02/07/2023: Chromogranin A 148.9 04/29/2023: Chromogranin A 112.3 (used to be 148.9), CBC CMP no major abnormalities. 08/05/2023: Chromogranin A 128.5, CBC CMP benign 02/10/2024: Chromogranin A 121, CBC CMP: Benign   Plan: Surveillance 01/17/2019: CT abd and Pelvis: No evidence of tumor recurrence. 3 mm RLL lung nodule probably benign 01/22/2020: CT abdomen pelvis: No signs of disease recurrence.  Signs of mild bowel resection, moderate hiatal hernia  04/08/2022: Negative PET Dotatate scan. No findings for recurrent or metastatic carcinoid tumor. 03/07/2023: Dotatate PET scan: No evidence of well-differentiated neuroendocrine tumor   Bursitis hip: Better Went to [place] in Aug 2024 for a conference.   Recheck labs again in 6 months and follow-up after that with telephone visit.

## 2024-04-09 DIAGNOSIS — J309 Allergic rhinitis, unspecified: Secondary | ICD-10-CM | POA: Diagnosis not present

## 2024-04-09 DIAGNOSIS — E039 Hypothyroidism, unspecified: Secondary | ICD-10-CM | POA: Diagnosis not present

## 2024-04-09 DIAGNOSIS — I10 Essential (primary) hypertension: Secondary | ICD-10-CM | POA: Diagnosis not present

## 2024-04-09 DIAGNOSIS — E7800 Pure hypercholesterolemia, unspecified: Secondary | ICD-10-CM | POA: Diagnosis not present

## 2024-08-14 DIAGNOSIS — Z23 Encounter for immunization: Secondary | ICD-10-CM | POA: Diagnosis not present

## 2024-08-16 ENCOUNTER — Inpatient Hospital Stay: Attending: Hematology and Oncology

## 2024-08-16 DIAGNOSIS — Z85060 Personal history of malignant carcinoid tumor of small intestine: Secondary | ICD-10-CM | POA: Insufficient documentation

## 2024-08-16 DIAGNOSIS — R978 Other abnormal tumor markers: Secondary | ICD-10-CM | POA: Insufficient documentation

## 2024-08-16 DIAGNOSIS — C7A019 Malignant carcinoid tumor of the small intestine, unspecified portion: Secondary | ICD-10-CM

## 2024-08-16 LAB — CBC WITH DIFFERENTIAL (CANCER CENTER ONLY)
Abs Immature Granulocytes: 0.03 K/uL (ref 0.00–0.07)
Basophils Absolute: 0 K/uL (ref 0.0–0.1)
Basophils Relative: 1 %
Eosinophils Absolute: 0.1 K/uL (ref 0.0–0.5)
Eosinophils Relative: 2 %
HCT: 39.8 % (ref 36.0–46.0)
Hemoglobin: 13.1 g/dL (ref 12.0–15.0)
Immature Granulocytes: 1 %
Lymphocytes Relative: 29 %
Lymphs Abs: 1.3 K/uL (ref 0.7–4.0)
MCH: 30 pg (ref 26.0–34.0)
MCHC: 32.9 g/dL (ref 30.0–36.0)
MCV: 91.3 fL (ref 80.0–100.0)
Monocytes Absolute: 0.9 K/uL (ref 0.1–1.0)
Monocytes Relative: 20 %
Neutro Abs: 2.2 K/uL (ref 1.7–7.7)
Neutrophils Relative %: 47 %
Platelet Count: 306 K/uL (ref 150–400)
RBC: 4.36 MIL/uL (ref 3.87–5.11)
RDW: 12.2 % (ref 11.5–15.5)
WBC Count: 4.4 K/uL (ref 4.0–10.5)
nRBC: 0 % (ref 0.0–0.2)

## 2024-08-16 LAB — CMP (CANCER CENTER ONLY)
ALT: 23 U/L (ref 0–44)
AST: 27 U/L (ref 15–41)
Albumin: 3.9 g/dL (ref 3.5–5.0)
Alkaline Phosphatase: 57 U/L (ref 38–126)
Anion gap: 5 (ref 5–15)
BUN: 21 mg/dL (ref 8–23)
CO2: 32 mmol/L (ref 22–32)
Calcium: 9.3 mg/dL (ref 8.9–10.3)
Chloride: 105 mmol/L (ref 98–111)
Creatinine: 1.18 mg/dL — ABNORMAL HIGH (ref 0.44–1.00)
GFR, Estimated: 48 mL/min — ABNORMAL LOW (ref >60)
Glucose, Bld: 70 mg/dL (ref 70–99)
Potassium: 4 mmol/L (ref 3.5–5.1)
Sodium: 142 mmol/L (ref 135–145)
Total Bilirubin: 0.5 mg/dL (ref 0.0–1.2)
Total Protein: 6.8 g/dL (ref 6.5–8.1)

## 2024-08-18 LAB — CHROMOGRANIN A: Chromogranin A (ng/mL): 169.3 ng/mL — ABNORMAL HIGH (ref 0.0–101.8)

## 2024-08-20 DIAGNOSIS — Z1231 Encounter for screening mammogram for malignant neoplasm of breast: Secondary | ICD-10-CM | POA: Diagnosis not present

## 2024-08-23 ENCOUNTER — Inpatient Hospital Stay: Admitting: Hematology and Oncology

## 2024-08-23 DIAGNOSIS — C7A019 Malignant carcinoid tumor of the small intestine, unspecified portion: Secondary | ICD-10-CM | POA: Diagnosis not present

## 2024-08-23 NOTE — Assessment & Plan Note (Signed)
 Well-differentiated neuroendocrine tumor, carcinoid with 1/3 positive lymph node and serosal invasion Dr. Rubin who performed small bowel resection with anastomosis on 07/27/2016.    Blood work   01/22/2020: CBC CMP and normal.Chromogranin A : 100 (normal) 03/18/2022: CBC CMP normal, chromogranin A 122.9 PET CT scan (Dotatate): Benign 10/15/2022: CBC CMP: Normal chromogranin A 93.8 02/07/2023: Chromogranin A 148.9 04/29/2023: Chromogranin A 112.3 (used to be 148.9), CBC CMP no major abnormalities. 08/05/2023: Chromogranin A 128.5, CBC CMP benign 02/10/2024: Chromogranin A 121, CBC CMP: Benign 08/06/2024: Chromogranin A 169.3, CBC CMP benign   Plan: Surveillance 01/17/2019: CT abd and Pelvis: No evidence of tumor recurrence. 3 mm RLL lung nodule probably benign 01/22/2020: CT abdomen pelvis: No signs of disease recurrence.  Signs of mild bowel resection, moderate hiatal hernia  04/08/2022: Negative PET Dotatate scan. No findings for recurrent or metastatic carcinoid tumor. 03/07/2023: Dotatate PET scan: No evidence of well-differentiated neuroendocrine tumor   Bursitis hip: Better   Went to [place] Jan 2024 and [place]   Could be moving to [facility] once her husband to retire.   Recheck labs again in 6 months and follow-up after that with telephone visit.

## 2024-08-23 NOTE — Progress Notes (Signed)
 HEMATOLOGY-ONCOLOGY TELEPHONE VISIT PROGRESS NOTE  I connected with our patient on 08/23/24 at  2:30 PM EDT by telephone and verified that I am speaking with the correct person using two identifiers.  I discussed the limitations, risks, security and privacy concerns of performing an evaluation and management service by telephone and the availability of in person appointments.  I also discussed with the patient that there may be a patient responsible charge related to this service. The patient expressed understanding and agreed to proceed.   History of Present Illness: Follow-up of recently performed labs for carcinoid tumor  History of Present Illness Anne Stark is a 73 year old female who presents for follow-up of elevated chromogranin A levels.  Over the past year, chromogranin A levels have fluctuated between 110 and 148, with the most recent level at 169, the highest recorded. Previous imaging in April showed no evidence of disease.  She has no new or different symptoms and denies any abdominal symptoms. She experiences significant stress due to recent life changes, including moving to [place], managing her sister's transition to a memory care facility, and selling a rental property. Stress contributes to fatigue, but she has not experienced any illnesses such as the flu or fever. She copes well with adequate rest.  REVIEW OF SYSTEMS:   Constitutional: Denies fevers, chills or abnormal weight loss All other systems were reviewed with the patient and are negative. 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 Rubin who performed small bowel resection with anastomosis on 07/27/2016.    Blood work   01/22/2020: CBC CMP and normal.Chromogranin A : 100 (normal) 03/18/2022: CBC CMP normal, chromogranin A 122.9 PET CT scan (Dotatate):  Benign 10/15/2022: CBC CMP: Normal chromogranin A 93.8 02/07/2023: Chromogranin A 148.9 04/29/2023: Chromogranin A 112.3 (used to be 148.9), CBC CMP no major abnormalities. 08/05/2023: Chromogranin A 128.5, CBC CMP benign 02/10/2024: Chromogranin A 121, CBC CMP: Benign 08/06/2024: Chromogranin A 169.3, CBC CMP benign   Plan: Surveillance 01/17/2019: CT abd and Pelvis: No evidence of tumor recurrence. 3 mm RLL lung nodule probably benign 01/22/2020: CT abdomen pelvis: No signs of disease recurrence.  Signs of mild bowel resection, moderate hiatal hernia  04/08/2022: Negative PET Dotatate scan. No findings for recurrent or metastatic carcinoid tumor. 03/07/2023: Dotatate PET scan: No evidence of well-differentiated neuroendocrine tumor   Bursitis hip: Better   Went to [place] Jan 2024 and [place]   Could be moving to [facility] once her husband to retire. She is moving to [facility] She is under a lot of stress from different reasons including the move.  Therefore we decided to recheck her chromogranin A level in 3 months and I will call her with the result a week later.        I discussed the assessment and treatment plan with the patient. The patient was provided an opportunity to ask questions and all were answered. The patient agreed with the plan and demonstrated an understanding of the instructions. The patient was advised to call back or seek an in-person evaluation if the symptoms worsen or if the condition fails to improve as anticipated.   I provided 20 minutes of non-face-to-face time during this encounter.  This includes time for charting and coordination of care   Naomi MARLA Chad, MD

## 2024-10-22 DIAGNOSIS — E7800 Pure hypercholesterolemia, unspecified: Secondary | ICD-10-CM | POA: Diagnosis not present

## 2024-10-22 DIAGNOSIS — I872 Venous insufficiency (chronic) (peripheral): Secondary | ICD-10-CM | POA: Diagnosis not present

## 2024-10-22 DIAGNOSIS — Z1331 Encounter for screening for depression: Secondary | ICD-10-CM | POA: Diagnosis not present

## 2024-10-22 DIAGNOSIS — M8580 Other specified disorders of bone density and structure, unspecified site: Secondary | ICD-10-CM | POA: Diagnosis not present

## 2024-10-22 DIAGNOSIS — Z23 Encounter for immunization: Secondary | ICD-10-CM | POA: Diagnosis not present

## 2024-10-22 DIAGNOSIS — E039 Hypothyroidism, unspecified: Secondary | ICD-10-CM | POA: Diagnosis not present

## 2024-10-22 DIAGNOSIS — D3A019 Benign carcinoid tumor of the small intestine, unspecified portion: Secondary | ICD-10-CM | POA: Diagnosis not present

## 2024-10-22 DIAGNOSIS — I10 Essential (primary) hypertension: Secondary | ICD-10-CM | POA: Diagnosis not present

## 2024-10-22 DIAGNOSIS — Z0000 Encounter for general adult medical examination without abnormal findings: Secondary | ICD-10-CM | POA: Diagnosis not present

## 2024-12-05 ENCOUNTER — Other Ambulatory Visit: Payer: Self-pay | Admitting: *Deleted

## 2024-12-05 DIAGNOSIS — C7A019 Malignant carcinoid tumor of the small intestine, unspecified portion: Secondary | ICD-10-CM

## 2024-12-06 ENCOUNTER — Inpatient Hospital Stay: Attending: Hematology and Oncology

## 2024-12-06 DIAGNOSIS — C7A019 Malignant carcinoid tumor of the small intestine, unspecified portion: Secondary | ICD-10-CM

## 2024-12-06 LAB — CBC WITH DIFFERENTIAL (CANCER CENTER ONLY)
Abs Immature Granulocytes: 0.02 K/uL (ref 0.00–0.07)
Basophils Absolute: 0.1 K/uL (ref 0.0–0.1)
Basophils Relative: 1 %
Eosinophils Absolute: 0.2 K/uL (ref 0.0–0.5)
Eosinophils Relative: 3 %
HCT: 43.4 % (ref 36.0–46.0)
Hemoglobin: 14.2 g/dL (ref 12.0–15.0)
Immature Granulocytes: 0 %
Lymphocytes Relative: 43 %
Lymphs Abs: 2.5 K/uL (ref 0.7–4.0)
MCH: 30.1 pg (ref 26.0–34.0)
MCHC: 32.7 g/dL (ref 30.0–36.0)
MCV: 91.9 fL (ref 80.0–100.0)
Monocytes Absolute: 0.6 K/uL (ref 0.1–1.0)
Monocytes Relative: 11 %
Neutro Abs: 2.5 K/uL (ref 1.7–7.7)
Neutrophils Relative %: 42 %
Platelet Count: 390 K/uL (ref 150–400)
RBC: 4.72 MIL/uL (ref 3.87–5.11)
RDW: 12 % (ref 11.5–15.5)
WBC Count: 5.9 K/uL (ref 4.0–10.5)
nRBC: 0 % (ref 0.0–0.2)

## 2024-12-06 LAB — CMP (CANCER CENTER ONLY)
ALT: 17 U/L (ref 0–44)
AST: 25 U/L (ref 15–41)
Albumin: 4.2 g/dL (ref 3.5–5.0)
Alkaline Phosphatase: 64 U/L (ref 38–126)
Anion gap: 9 (ref 5–15)
BUN: 23 mg/dL (ref 8–23)
CO2: 28 mmol/L (ref 22–32)
Calcium: 9.8 mg/dL (ref 8.9–10.3)
Chloride: 105 mmol/L (ref 98–111)
Creatinine: 1.01 mg/dL — ABNORMAL HIGH (ref 0.44–1.00)
GFR, Estimated: 58 mL/min — ABNORMAL LOW
Glucose, Bld: 97 mg/dL (ref 70–99)
Potassium: 4.5 mmol/L (ref 3.5–5.1)
Sodium: 142 mmol/L (ref 135–145)
Total Bilirubin: 0.5 mg/dL (ref 0.0–1.2)
Total Protein: 7.2 g/dL (ref 6.5–8.1)

## 2024-12-08 LAB — CHROMOGRANIN A: Chromogranin A (ng/mL): 109.1 ng/mL — ABNORMAL HIGH (ref 0.0–101.8)

## 2024-12-13 ENCOUNTER — Inpatient Hospital Stay: Admitting: Hematology and Oncology

## 2024-12-13 DIAGNOSIS — C7A019 Malignant carcinoid tumor of the small intestine, unspecified portion: Secondary | ICD-10-CM

## 2024-12-13 NOTE — Progress Notes (Signed)
 HEMATOLOGY-ONCOLOGY TELEPHONE VISIT PROGRESS NOTE  I connected with our patient on 12/13/24 at  8:45 AM EST by telephone and verified that I am speaking with the correct person using two identifiers.  I discussed the limitations, risks, security and privacy concerns of performing an evaluation and management service by telephone and the availability of in person appointments.  I also discussed with the patient that there may be a patient responsible charge related to this service. The patient expressed understanding and agreed to proceed.   History of Present Illness: Follow-up of recently performed blood work for carcinoid  History of Present Illness Anne Stark is a 74 year old female with well-differentiated malignant carcinoid tumor of the small intestine, status post curative resection, who presents for routine oncology surveillance.  Eight years ago she had small bowel resection for carcinoid tumor and has had serial imaging and laboratory monitoring since. Recent Chromogranin A levels are improved compared with prior results.  She is asymptomatic without abdominal pain, fatigue, or other symptoms suggestive of recurrence and feels her overall health has improved.  She has stable thyroid  disease without new endocrine symptoms relevant to today's visit.       REVIEW OF SYSTEMS:   Constitutional: Denies fevers, chills or abnormal weight loss All other systems were reviewed with the patient and are negative. 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 Rubin who performed small bowel resection with anastomosis on 07/27/2016.    Blood work   01/22/2020: CBC CMP and normal.Chromogranin A : 100 (normal) 03/18/2022: CBC CMP normal, chromogranin A 122.9 PET CT scan (Dotatate): Benign 10/15/2022: CBC CMP: Normal chromogranin A 93.8 02/07/2023:  Chromogranin A 148.9 04/29/2023: Chromogranin A 112.3 (used to be 148.9), CBC CMP no major abnormalities. 08/05/2023: Chromogranin A 128.5, CBC CMP benign 02/10/2024: Chromogranin A 121, CBC CMP: Benign 08/06/2024: Chromogranin A 169.3, CBC CMP benign 12/07/2023: Chromogranin A 109.1   Plan: Surveillance 01/17/2019: CT abd and Pelvis: No evidence of tumor recurrence. 3 mm RLL lung nodule probably benign 01/22/2020: CT abdomen pelvis: No signs of disease recurrence.  Signs of mild bowel resection, moderate hiatal hernia  04/08/2022: Negative PET Dotatate scan. No findings for recurrent or metastatic carcinoid tumor. 03/07/2023: Dotatate PET scan: No evidence of well-differentiated neuroendocrine tumor   Bursitis hip: Better  She would like to travel to [place] but because of the unstable conditions she is unable to do so. Moved to [place]   Labs in 1 year and telephone follow in   With the chromogranin A level coming down I do not intend to obtain any scans at this time.   I discussed the assessment and treatment plan with the patient. The patient was provided an opportunity to ask questions and all were answered. The patient agreed with the plan and demonstrated an understanding of the instructions. The patient was advised to call back or seek an in-person evaluation if the symptoms worsen or if the condition fails to improve as anticipated.   I provided 20 minutes of non-face-to-face time during this encounter.  This includes time for charting and coordination of care   Naomi MARLA Chad, MD

## 2024-12-13 NOTE — Assessment & Plan Note (Signed)
 Well-differentiated neuroendocrine tumor, carcinoid with 1/3 positive lymph node and serosal invasion Dr. Rubin who performed small bowel resection with anastomosis on 07/27/2016.    Blood work   01/22/2020: CBC CMP and normal.Chromogranin A : 100 (normal) 03/18/2022: CBC CMP normal, chromogranin A 122.9 PET CT scan (Dotatate): Benign 10/15/2022: CBC CMP: Normal chromogranin A 93.8 02/07/2023: Chromogranin A 148.9 04/29/2023: Chromogranin A 112.3 (used to be 148.9), CBC CMP no major abnormalities. 08/05/2023: Chromogranin A 128.5, CBC CMP benign 02/10/2024: Chromogranin A 121, CBC CMP: Benign 08/06/2024: Chromogranin A 169.3, CBC CMP benign 12/07/2023: Chromogranin A 109.1   Plan: Surveillance 01/17/2019: CT abd and Pelvis: No evidence of tumor recurrence. 3 mm RLL lung nodule probably benign 01/22/2020: CT abdomen pelvis: No signs of disease recurrence.  Signs of mild bowel resection, moderate hiatal hernia  04/08/2022: Negative PET Dotatate scan. No findings for recurrent or metastatic carcinoid tumor. 03/07/2023: Dotatate PET scan: No evidence of well-differentiated neuroendocrine tumor   Bursitis hip: Better   Went to [place] Jan 2024 and [place]    Could be moving to [facility] once her husband to retire. She is moving to [facility] She is under a lot of stress from different reasons including the move.   With the chromogranin A level coming down I do not intend to obtain any scans at this time.

## 2025-12-17 ENCOUNTER — Inpatient Hospital Stay

## 2025-12-24 ENCOUNTER — Inpatient Hospital Stay: Admitting: Hematology and Oncology
